# Patient Record
Sex: Female | Born: 1970 | Race: White | Hispanic: No | Marital: Married | Smoking: Never smoker
Health system: Southern US, Community
[De-identification: ages and names within clinical notes are randomized; demographics above are authoritative.]

## PROBLEM LIST (undated history)

## (undated) DIAGNOSIS — S82201A Unspecified fracture of shaft of right tibia, initial encounter for closed fracture: Secondary | ICD-10-CM

## (undated) DIAGNOSIS — G43909 Migraine, unspecified, not intractable, without status migrainosus: Secondary | ICD-10-CM

## (undated) HISTORY — DX: Migraine, unspecified, not intractable, without status migrainosus: G43.909

## (undated) HISTORY — DX: Unspecified fracture of shaft of right tibia, initial encounter for closed fracture: S82.201A

## (undated) HISTORY — PX: NO PAST SURGERIES: SHX2092

---

## 2011-10-14 ENCOUNTER — Ambulatory Visit: Payer: Self-pay | Admitting: Obstetrics and Gynecology

## 2011-11-09 ENCOUNTER — Ambulatory Visit: Payer: Self-pay | Admitting: Family Medicine

## 2012-10-14 ENCOUNTER — Ambulatory Visit: Payer: Self-pay | Admitting: Family

## 2013-01-18 ENCOUNTER — Emergency Department: Payer: Self-pay | Admitting: Internal Medicine

## 2013-10-18 ENCOUNTER — Ambulatory Visit: Payer: Self-pay | Admitting: Obstetrics and Gynecology

## 2013-10-23 ENCOUNTER — Ambulatory Visit: Payer: Self-pay | Admitting: Obstetrics and Gynecology

## 2015-12-06 ENCOUNTER — Telehealth: Payer: Self-pay

## 2015-12-06 NOTE — Telephone Encounter (Signed)
LVM FOR PT TO CALL BACK AND SCHEDULE AN APPT WITH DR. Nolon Rod

## 2017-02-16 ENCOUNTER — Encounter (INDEPENDENT_AMBULATORY_CARE_PROVIDER_SITE_OTHER): Payer: Self-pay

## 2017-02-16 ENCOUNTER — Ambulatory Visit (INDEPENDENT_AMBULATORY_CARE_PROVIDER_SITE_OTHER): Payer: BLUE CROSS/BLUE SHIELD | Admitting: Internal Medicine

## 2017-02-16 ENCOUNTER — Encounter: Payer: Self-pay | Admitting: Internal Medicine

## 2017-02-16 VITALS — BP 116/78 | HR 71 | Temp 98.0°F | Wt 174.0 lb

## 2017-02-16 DIAGNOSIS — Z8349 Family history of other endocrine, nutritional and metabolic diseases: Secondary | ICD-10-CM

## 2017-02-16 DIAGNOSIS — G43839 Menstrual migraine, intractable, without status migrainosus: Secondary | ICD-10-CM

## 2017-02-16 DIAGNOSIS — G43909 Migraine, unspecified, not intractable, without status migrainosus: Secondary | ICD-10-CM | POA: Insufficient documentation

## 2017-02-16 LAB — CBC
HCT: 42.2 % (ref 36.0–46.0)
Hemoglobin: 14.4 g/dL (ref 12.0–15.0)
MCHC: 34.2 g/dL (ref 30.0–36.0)
MCV: 88.7 fl (ref 78.0–100.0)
PLATELETS: 233 10*3/uL (ref 150.0–400.0)
RBC: 4.76 Mil/uL (ref 3.87–5.11)
RDW: 13.4 % (ref 11.5–15.5)
WBC: 5.8 10*3/uL (ref 4.0–10.5)

## 2017-02-16 LAB — COMPREHENSIVE METABOLIC PANEL
ALT: 24 U/L (ref 0–35)
AST: 20 U/L (ref 0–37)
Albumin: 4.1 g/dL (ref 3.5–5.2)
Alkaline Phosphatase: 55 U/L (ref 39–117)
BILIRUBIN TOTAL: 0.6 mg/dL (ref 0.2–1.2)
BUN: 20 mg/dL (ref 6–23)
CO2: 28 meq/L (ref 19–32)
CREATININE: 0.63 mg/dL (ref 0.40–1.20)
Calcium: 9.4 mg/dL (ref 8.4–10.5)
Chloride: 102 mEq/L (ref 96–112)
GFR: 107.01 mL/min (ref >60.00)
GLUCOSE: 99 mg/dL (ref 70–99)
Potassium: 3.7 mEq/L (ref 3.5–5.1)
Sodium: 137 mEq/L (ref 135–145)
Total Protein: 7.4 g/dL (ref 6.0–8.3)

## 2017-02-16 LAB — IBC PANEL
IRON: 113 ug/dL (ref 42–145)
SATURATION RATIOS: 29.7 % (ref 20.0–50.0)
TRANSFERRIN: 272 mg/dL (ref 212.0–360.0)

## 2017-02-16 LAB — FERRITIN: Ferritin: 68.2 ng/mL (ref 10.0–291.0)

## 2017-02-16 NOTE — Patient Instructions (Signed)
Hemochromatosis Hemochromatosis, also called iron storage disease, is a condition in which the body stores too much iron. The excess iron builds up in your joints, heart, liver, pancreas, and other organs and damages them. What are the causes? Hemochromatosis may be caused by:  Abnormal genes. These are passed down (inherited) from both of your parents.  Having blood transfusions.  Having too much iron in your diet.  What increases the risk?  Being Caucasian.  Inheriting abnormal genes from both your parents.  Having a severe or long-term loss of red blood cells (anemia). What are the signs or symptoms? Signs and symptoms can start at any age, but usually start in middle age. They may include:  Fatigue.  Weakness.  Joint pain.  Abdominal pain.  Weight loss.  Pearline Cables or bronze skin coloring.  Loss of interest in sex.  Loss of menstrual periods in women.  Loss of body hair.  Shortness of breath.  Late signs of hemochromatosis include damage to the liver, heart, or pancreas. This may lead to:  Liver cancer.  Abnormal heart rhythms.  Heart failure.  Diabetes.  How is this diagnosed? Your health care provider will perform a physical exam and ask about your symptoms and family history. Tests may be done to confirm the diagnosis. Blood tests may include:  Transferrin saturation. This test measures how much iron is bound to hemoglobin in your blood. Hemoglobin is a substance in red blood cells that carries oxygen to the tissues of the body.  Serum ferritin. This test measures a protein that stores iron in your blood.  A test to check for the abnormal genes that cause the condition.  You may have a tissue sample taken from your liver with a needle (biopsy) for testing. The results will show if iron is building up in your liver. How is this treated? Hemochromatosis is treated by removing iron by taking blood (phlebotomy). Having a phlebotomy is similar to having blood  taken for donation. The procedure is simple and effective as long as it is started before organ damage develops. When you begin treatment, you may have a pint of blood removed once or twice a week. You may have blood tests done to determine when your iron levels return to normal. Once your iron levels are normal, you may only need to have a phlebotomy every few months. Follow these instructions at home:  Do not eat a lot of foods that are high in iron. Iron-rich foods include red meats and organ meats.  Do not take dietary supplements that contain iron.  Do not take vitamin C supplements. Vitamin C increases iron absorption.  Do not eat raw shellfish or raw fish. Hemochromatosis may increase your chance of infection from these foods.  If you have liver damage, do not drink any alcohol.  If you do not have liver damage, limit alcohol intake to no more than 1 drink per day for nonpregnant women and 2 drinks per day for men. One drink equals 12 ounces of beer, 5 ounces of wine, or 1 ounces of hard liquor.  Try to exercise for at least 30 minutes on most days of the week.  Keep all follow-up visits as directed by your health care provider. This is important. Contact a health care provider if:  You have fatigue.  You have weakness.  You have joint pain.  You have abdominal pain.  You experience weight loss.  You have shortness of breath. Get help right away if:  You have  chest pain.  You have trouble breathing. This information is not intended to replace advice given to you by your health care provider. Make sure you discuss any questions you have with your health care provider. Document Released: 12/20/1999 Document Revised: 05/30/2015 Document Reviewed: 02/15/2013 Elsevier Interactive Patient Education  [employer].

## 2017-02-16 NOTE — Assessment & Plan Note (Signed)
Controlled with Excedrin and Imitrex Will monitor for now

## 2017-02-16 NOTE — Progress Notes (Signed)
HPI  Pt presents to the clinic today to establish care. She has not had a PCP in many years. She has been seeing GYN.  Migraines: These occur monthly around her period. She takes Excedrin Migraine, if that doesn't work, she will take Imitrex nasal spray. She was on OCP's around age 47's and her headaches were not    Family History of Hemachromatosis: She has never been screened from this. Her sister actually passed away from complications of this. She would like to be screened today.   Flu: 2017 with employer Tetanus: > 10 years ago Pap Smear: 08/2014, [facility] Rout Mammogram: 10/2014 Vision Screening: annually  Dentist: biannually  Past Medical History:  Diagnosis Date  . Migraines     Current Outpatient Medications  Medication Sig Dispense Refill  . aspirin-acetaminophen-caffeine (EXCEDRIN MIGRAINE) 250-250-65 MG tablet Take 2 tablets by mouth every 6 (six) hours as needed for headache.    . SUMAtriptan (IMITREX) 20 MG/ACT nasal spray Place 20 mg into the nose every 2 (two) hours as needed for migraine or headache. May repeat in 2 hours if headache persists or recurs.     No current facility-administered medications for this visit.     Allergies  Allergen Reactions  . Penicillins Hives    Family History  Problem Relation Age of Onset  . Heart attack Mother   . Ovarian cancer Mother   . Stroke Father   . Hemochromatosis Sister   . Ovarian cancer Sister     Social History   Socioeconomic History  . Marital status: Single    Spouse name: Not on file  . Number of children: Not on file  . Years of education: Not on file  . Highest education level: Not on file  Social Needs  . Financial resource strain: Not on file  . Food insecurity - worry: Not on file  . Food insecurity - inability: Not on file  . Transportation needs - medical: Not on file  . Transportation needs - non-medical: Not on file  Occupational History  . Not on file  Tobacco Use  . Smoking status:  Never Smoker  . Smokeless tobacco: Never Used  Substance and Sexual Activity  . Alcohol use: Yes    Comment: occasional   . Drug use: No  . Sexual activity: Not on file  Other Topics Concern  . Not on file  Social History Narrative  . Not on file    ROS:  Constitutional: Pt reports intermittent headaches. Denies fever, malaise, fatigue, or abrupt weight changes.  Respiratory: Denies difficulty breathing, shortness of breath, cough or sputum production.   Cardiovascular: Denies chest pain, chest tightness, palpitations or swelling in the hands or feet.  Gastrointestinal: Denies abdominal pain, bloating, constipation, diarrhea or blood in the stool.  GU: Denies frequency, urgency, pain with urination, blood in urine, odor or discharge. Neurological: Denies dizziness, difficulty with memory, difficulty with speech or problems with balance and coordination.  Psych: Denies anxiety, depression, SI/HI.  No other specific complaints in a complete review of systems (except as listed in HPI above).  PE:  BP 116/78   Pulse 71   Temp 98 F (36.7 C) (Oral)   Wt 174 lb (78.9 kg)   LMP 02/05/2017   SpO2 98%  Wt Readings from Last 3 Encounters:  02/16/17 174 lb (78.9 kg)    General: Appears her stated age, well developed, well nourished in NAD. Skin: Dry and intact. Cardiovascular: Normal rate and rhythm. Pulmonary/Chest: Normal effort and  positive vesicular breath sounds. No respiratory distress. No wheezes, rales or ronchi noted.  Neurological: Alert and oriented.  Psychiatric: Mood and affect normal. Behavior is normal. Judgment and thought content normal.    Assessment and Plan:  Family History of Hemochromatosis:  Will check CBC, CMET, Ferritin and IBC today May need geneticist referral.  Make an appt for your annual exam Webb Silversmith, NP

## 2017-04-06 ENCOUNTER — Ambulatory Visit (INDEPENDENT_AMBULATORY_CARE_PROVIDER_SITE_OTHER): Payer: BLUE CROSS/BLUE SHIELD | Admitting: Internal Medicine

## 2017-04-06 ENCOUNTER — Encounter: Payer: Self-pay | Admitting: Internal Medicine

## 2017-04-06 VITALS — BP 114/76 | HR 60 | Temp 98.1°F | Ht 65.5 in | Wt 174.0 lb

## 2017-04-06 DIAGNOSIS — Z0001 Encounter for general adult medical examination with abnormal findings: Secondary | ICD-10-CM | POA: Diagnosis not present

## 2017-04-06 DIAGNOSIS — Z1231 Encounter for screening mammogram for malignant neoplasm of breast: Secondary | ICD-10-CM | POA: Diagnosis not present

## 2017-04-06 DIAGNOSIS — Z23 Encounter for immunization: Secondary | ICD-10-CM | POA: Diagnosis not present

## 2017-04-06 DIAGNOSIS — N907 Vulvar cyst: Secondary | ICD-10-CM

## 2017-04-06 DIAGNOSIS — Z1239 Encounter for other screening for malignant neoplasm of breast: Secondary | ICD-10-CM

## 2017-04-06 LAB — COMPREHENSIVE METABOLIC PANEL
ALT: 24 U/L (ref 0–35)
AST: 23 U/L (ref 0–37)
Albumin: 4 g/dL (ref 3.5–5.2)
Alkaline Phosphatase: 59 U/L (ref 39–117)
BUN: 21 mg/dL (ref 6–23)
CHLORIDE: 102 meq/L (ref 96–112)
CO2: 29 mEq/L (ref 19–32)
Calcium: 9.7 mg/dL (ref 8.4–10.5)
Creatinine, Ser: 0.61 mg/dL (ref 0.40–1.20)
GFR: 111.01 mL/min (ref >60.00)
GLUCOSE: 100 mg/dL — AB (ref 70–99)
POTASSIUM: 3.8 meq/L (ref 3.5–5.1)
SODIUM: 136 meq/L (ref 135–145)
Total Bilirubin: 0.5 mg/dL (ref 0.2–1.2)
Total Protein: 7.7 g/dL (ref 6.0–8.3)

## 2017-04-06 LAB — LIPID PANEL
CHOLESTEROL: 152 mg/dL (ref 0–200)
HDL: 51.4 mg/dL (ref >39.00)
LDL CALC: 82 mg/dL (ref 0–99)
NONHDL: 100.44
Total CHOL/HDL Ratio: 3
Triglycerides: 90 mg/dL (ref 0.0–149.0)
VLDL: 18 mg/dL (ref 0.0–40.0)

## 2017-04-06 LAB — CBC
HCT: 43.3 % (ref 36.0–46.0)
HEMOGLOBIN: 14.6 g/dL (ref 12.0–15.0)
MCHC: 33.7 g/dL (ref 30.0–36.0)
MCV: 89.3 fl (ref 78.0–100.0)
Platelets: 244 10*3/uL (ref 150.0–400.0)
RBC: 4.85 Mil/uL (ref 3.87–5.11)
RDW: 13.4 % (ref 11.5–15.5)
WBC: 6.2 10*3/uL (ref 4.0–10.5)

## 2017-04-06 LAB — VITAMIN D 25 HYDROXY (VIT D DEFICIENCY, FRACTURES): VITD: 27.48 ng/mL — AB (ref 30.00–100.00)

## 2017-04-06 NOTE — Progress Notes (Signed)
Subjective:    Patient ID: Alyssa Bonilla, female    DOB: 07-Jun-1970, 47 y.o.   MRN: [id number]  HPI  Pt presents to the clinic today for her annual exam.   Flu: 2017 Tetanus:  >10 years ago Pap Smear: 08/2014 Mammogram: 10/2013 Vision Screening: annually Dentist: quarterly  Diet: She does eat meat. She consumes fruits and veggies daily. She does eat fried foods. She drinks mostly water. Exercise: Walking for 45 minutes 2-3 days of the week.  Review of Systems        Past Medical History:  Diagnosis Date  . Migraines     Current Outpatient Medications  Medication Sig Dispense Refill  . aspirin-acetaminophen-caffeine (EXCEDRIN MIGRAINE) 250-250-65 MG tablet Take 2 tablets by mouth every 6 (six) hours as needed for headache.    . Multiple Vitamins-Minerals (THRIVE FOR LIFE WOMENS) TABS Take 1 tablet by mouth daily.    . SUMAtriptan (IMITREX) 20 MG/ACT nasal spray Place 20 mg into the nose every 2 (two) hours as needed for migraine or headache. May repeat in 2 hours if headache persists or recurs.     No current facility-administered medications for this visit.     Allergies  Allergen Reactions  . Penicillins Hives    Family History  Problem Relation Age of Onset  . Heart attack Mother   . Ovarian cancer Mother   . Stroke Father   . Hemochromatosis Sister   . Ovarian cancer Sister     Social History   Socioeconomic History  . Marital status: Single    Spouse name: Not on file  . Number of children: Not on file  . Years of education: Not on file  . Highest education level: Not on file  Occupational History  . Not on file  Social Needs  . Financial resource strain: Not on file  . Food insecurity:    Worry: Not on file    Inability: Not on file  . Transportation needs:    Medical: Not on file    Non-medical: Not on file  Tobacco Use  . Smoking status: Never Smoker  . Smokeless tobacco: Never Used  Substance and Sexual Activity  . Alcohol use: Yes     Comment: occasional   . Drug use: No  . Sexual activity: Not on file  Lifestyle  . Physical activity:    Days per week: Not on file    Minutes per session: Not on file  . Stress: Not on file  Relationships  . Social connections:    Talks on phone: Not on file    Gets together: Not on file    Attends religious service: Not on file    Active member of club or organization: Not on file    Attends meetings of clubs or organizations: Not on file    Relationship status: Not on file  . Intimate partner violence:    Fear of current or ex partner: Not on file    Emotionally abused: Not on file    Physically abused: Not on file    Forced sexual activity: Not on file  Other Topics Concern  . Not on file  Social History Narrative  . Not on file     Constitutional: Pt reports intermittent headaches. Denies fever, malaise, fatigue, or abrupt weight changes.  HEENT: Denies eye pain, eye redness, ear pain, ringing in the ears, wax buildup, runny nose, nasal congestion, bloody nose, or sore throat. Respiratory: Denies difficulty breathing, shortness of breath,  cough or sputum production.   Cardiovascular: Denies chest pain, chest tightness, palpitations or swelling in the hands or feet.  Gastrointestinal: Pt reports intermittent reflux. Denies abdominal pain, bloating, constipation, diarrhea or blood in the stool.  GU: Denies urgency, frequency, pain with urination, burning sensation, blood in urine, odor or discharge. Musculoskeletal: Denies decrease in range of motion, difficulty with gait, muscle pain or joint pain and swelling.  Skin: Pt reports lump of left labia. Denies redness, rashes, or ulcercations.  Neurological: Denies dizziness, difficulty with memory, difficulty with speech or problems with balance and coordination.  Psych: Denies anxiety, depression, SI/HI.  No other specific complaints in a complete review of systems (except as listed in HPI above).  Objective:   Physical  Exam  BP 114/76   Pulse 60   Temp 98.1 F (36.7 C) (Oral)   Ht 5' 5.5" (1.664 m)   Wt 174 lb (78.9 kg)   SpO2 98%   BMI 28.51 kg/m  Wt Readings from Last 3 Encounters:  04/06/17 174 lb (78.9 kg)  02/16/17 174 lb (78.9 kg)    General: Appears her stated age, well developed, well nourished in NAD. Skin: Warm, dry and intact. Cyst noted of left lower labia majora. HEENT: Head: normal shape and size; Eyes: sclera white, no icterus, conjunctiva pink, PERRLA and EOMs intact; Ears: Tm's gray and intact, normal light reflex; Throat/Mouth: Teeth present, mucosa pink and moist, no exudate, lesions or ulcerations noted.  Neck:  Neck supple, trachea midline. No masses, lumps or thyromegaly present.  Cardiovascular: Normal rate and rhythm. S1,S2 noted.  No murmur, rubs or gallops noted. No JVD or BLE edema.  Pulmonary/Chest: Normal effort and positive vesicular breath sounds. No respiratory distress. No wheezes, rales or ronchi noted.  Abdomen: Soft and nontender. Normal bowel sounds. No distention or masses noted. Liver, spleen and kidneys non palpable. Musculoskeletal: Strength 5/5 BUE/BLE. No difficulty with gait.  Neurological: Alert and oriented. Cranial nerves II-XII grossly intact. Coordination normal.  Psychiatric: Mood and affect normal. Behavior is normal. Judgment and thought content normal.     BMET    Component Value Date/Time   NA 137 02/16/2017 0958   K 3.7 02/16/2017 0958   CL 102 02/16/2017 0958   CO2 28 02/16/2017 0958   GLUCOSE 99 02/16/2017 0958   BUN 20 02/16/2017 0958   CREATININE 0.63 02/16/2017 0958   CALCIUM 9.4 02/16/2017 0958    Lipid Panel  No results found for: CHOL, TRIG, HDL, CHOLHDL, VLDL, LDLCALC  CBC    Component Value Date/Time   WBC 5.8 02/16/2017 0958   RBC 4.76 02/16/2017 0958   HGB 14.4 02/16/2017 0958   HCT 42.2 02/16/2017 0958   PLT 233.0 02/16/2017 0958   MCV 88.7 02/16/2017 0958   MCHC 34.2 02/16/2017 0958   RDW 13.4 02/16/2017 0958     Hgb A1C No results found for: HGBA1C          Assessment & Plan:   Preventative Health Maintenance:  Encouraged her to get a flu shot in the fall Tdap today Pap smear UTD Mammogram ordered, she will call [facility] to schedule, number provided Encouraged her to consume a balanced diet and exercise regimen Continue to see an eye doctor and dentist annually Will check CBC, CMET, Lipid and Vit D today  Cyst of Labia:  Benign Advised her I could refer her to GYN for further evaluation to see if it could be removed, but she will hold off as it is not  bothering her at the moment.  RTC in 1 year, sooner if needed Webb Silversmith, NP

## 2017-04-06 NOTE — Patient Instructions (Signed)
Health Maintenance for Postmenopausal Women Menopause is a normal process in which your reproductive ability comes to an end. This process happens gradually over a span of months to years, usually between the ages of 22 and 9. Menopause is complete when you have missed 12 consecutive menstrual periods. It is important to talk with your health care provider about some of the most common conditions that affect postmenopausal women, such as heart disease, cancer, and bone loss (osteoporosis). Adopting a healthy lifestyle and getting preventive care can help to promote your health and wellness. Those actions can also lower your chances of developing some of these common conditions. What should I know about menopause? During menopause, you may experience a number of symptoms, such as:  Moderate-to-severe hot flashes.  Night sweats.  Decrease in sex drive.  Mood swings.  Headaches.  Tiredness.  Irritability.  Memory problems.  Insomnia.  Choosing to treat or not to treat menopausal changes is an individual decision that you make with your health care provider. What should I know about hormone replacement therapy and supplements? Hormone therapy products are effective for treating symptoms that are associated with menopause, such as hot flashes and night sweats. Hormone replacement carries certain risks, especially as you become older. If you are thinking about using estrogen or estrogen with progestin treatments, discuss the benefits and risks with your health care provider. What should I know about heart disease and stroke? Heart disease, heart attack, and stroke become more likely as you age. This may be due, in part, to the hormonal changes that your body experiences during menopause. These can affect how your body processes dietary fats, triglycerides, and cholesterol. Heart attack and stroke are both medical emergencies. There are many things that you can do to help prevent heart disease  and stroke:  Have your blood pressure checked at least every 1-2 years. High blood pressure causes heart disease and increases the risk of stroke.  If you are 53-22 years old, ask your health care provider if you should take aspirin to prevent a heart attack or a stroke.  Do not use any tobacco products, including cigarettes, chewing tobacco, or electronic cigarettes. If you need help quitting, ask your health care provider.  It is important to eat a healthy diet and maintain a healthy weight. ? Be sure to include plenty of vegetables, fruits, low-fat dairy products, and lean protein. ? Avoid eating foods that are high in solid fats, added sugars, or salt (sodium).  Get regular exercise. This is one of the most important things that you can do for your health. ? Try to exercise for at least 150 minutes each week. The type of exercise that you do should increase your heart rate and make you sweat. This is known as moderate-intensity exercise. ? Try to do strengthening exercises at least twice each week. Do these in addition to the moderate-intensity exercise.  Know your numbers.Ask your health care provider to check your cholesterol and your blood glucose. Continue to have your blood tested as directed by your health care provider.  What should I know about cancer screening? There are several types of cancer. Take the following steps to reduce your risk and to catch any cancer development as early as possible. Breast Cancer  Practice breast self-awareness. ? This means understanding how your breasts normally appear and feel. ? It also means doing regular breast self-exams. Let your health care provider know about any changes, no matter how small.  If you are 40  or older, have a clinician do a breast exam (clinical breast exam or CBE) every year. Depending on your age, family history, and medical history, it may be recommended that you also have a yearly breast X-ray (mammogram).  If you  have a family history of breast cancer, talk with your health care provider about genetic screening.  If you are at high risk for breast cancer, talk with your health care provider about having an MRI and a mammogram every year.  Breast cancer (BRCA) gene test is recommended for women who have family members with BRCA-related cancers. Results of the assessment will determine the need for genetic counseling and BRCA1 and for BRCA2 testing. BRCA-related cancers include these types: ? Breast. This occurs in males or females. ? Ovarian. ? Tubal. This may also be called fallopian tube cancer. ? Cancer of the abdominal or pelvic lining (peritoneal cancer). ? Prostate. ? Pancreatic.  Cervical, Uterine, and Ovarian Cancer Your health care provider may recommend that you be screened regularly for cancer of the pelvic organs. These include your ovaries, uterus, and vagina. This screening involves a pelvic exam, which includes checking for microscopic changes to the surface of your cervix (Pap test).  For women ages 21-65, health care providers may recommend a pelvic exam and a Pap test every three years. For women ages 63-65, they may recommend the Pap test and pelvic exam, combined with testing for human papilloma virus (HPV), every five years. Some types of HPV increase your risk of cervical cancer. Testing for HPV may also be done on women of any age who have unclear Pap test results.  Other health care providers may not recommend any screening for nonpregnant women who are considered low risk for pelvic cancer and have no symptoms. Ask your health care provider if a screening pelvic exam is right for you.  If you have had past treatment for cervical cancer or a condition that could lead to cancer, you need Pap tests and screening for cancer for at least 20 years after your treatment. If Pap tests have been discontinued for you, your risk factors (such as having a new sexual partner) need to be  reassessed to determine if you should start having screenings again. Some women have medical problems that increase the chance of getting cervical cancer. In these cases, your health care provider may recommend that you have screening and Pap tests more often.  If you have a family history of uterine cancer or ovarian cancer, talk with your health care provider about genetic screening.  If you have vaginal bleeding after reaching menopause, tell your health care provider.  There are currently no reliable tests available to screen for ovarian cancer.  Lung Cancer Lung cancer screening is recommended for adults 64-60 years old who are at high risk for lung cancer because of a history of smoking. A yearly low-dose CT scan of the lungs is recommended if you:  Currently smoke.  Have a history of at least 30 pack-years of smoking and you currently smoke or have quit within the past 15 years. A pack-year is smoking an average of one pack of cigarettes per day for one year.  Yearly screening should:  Continue until it has been 15 years since you quit.  Stop if you develop a health problem that would prevent you from having lung cancer treatment.  Colorectal Cancer  This type of cancer can be detected and can often be prevented.  Routine colorectal cancer screening usually begins at  age 42 and continues through age 45.  If you have risk factors for colon cancer, your health care provider may recommend that you be screened at an earlier age.  If you have a family history of colorectal cancer, talk with your health care provider about genetic screening.  Your health care provider may also recommend using home test kits to check for hidden blood in your stool.  A small camera at the end of a tube can be used to examine your colon directly (sigmoidoscopy or colonoscopy). This is done to check for the earliest forms of colorectal cancer.  Direct examination of the colon should be repeated every  5-10 years until age 71. However, if early forms of precancerous polyps or small growths are found or if you have a family history or genetic risk for colorectal cancer, you may need to be screened more often.  Skin Cancer  Check your skin from head to toe regularly.  Monitor any moles. Be sure to tell your health care provider: ? About any new moles or changes in moles, especially if there is a change in a mole's shape or color. ? If you have a mole that is larger than the size of a pencil eraser.  If any of your family members has a history of skin cancer, especially at a young age, talk with your health care provider about genetic screening.  Always use sunscreen. Apply sunscreen liberally and repeatedly throughout the day.  Whenever you are outside, protect yourself by wearing long sleeves, pants, a wide-brimmed hat, and sunglasses.  What should I know about osteoporosis? Osteoporosis is a condition in which bone destruction happens more quickly than new bone creation. After menopause, you may be at an increased risk for osteoporosis. To help prevent osteoporosis or the bone fractures that can happen because of osteoporosis, the following is recommended:  If you are 46-71 years old, get at least 1,000 mg of calcium and at least 600 mg of vitamin D per day.  If you are older than age 55 but younger than age 65, get at least 1,200 mg of calcium and at least 600 mg of vitamin D per day.  If you are older than age 54, get at least 1,200 mg of calcium and at least 800 mg of vitamin D per day.  Smoking and excessive alcohol intake increase the risk of osteoporosis. Eat foods that are rich in calcium and vitamin D, and do weight-bearing exercises several times each week as directed by your health care provider. What should I know about how menopause affects my mental health? Depression may occur at any age, but it is more common as you become older. Common symptoms of depression  include:  Low or sad mood.  Changes in sleep patterns.  Changes in appetite or eating patterns.  Feeling an overall lack of motivation or enjoyment of activities that you previously enjoyed.  Frequent crying spells.  Talk with your health care provider if you think that you are experiencing depression. What should I know about immunizations? It is important that you get and maintain your immunizations. These include:  Tetanus, diphtheria, and pertussis (Tdap) booster vaccine.  Influenza every year before the flu season begins.  Pneumonia vaccine.  Shingles vaccine.  Your health care provider may also recommend other immunizations. This information is not intended to replace advice given to you by your health care provider. Make sure you discuss any questions you have with your health care provider. Document Released: 02/13/2005  Document Revised: 07/12/2015 Document Reviewed: 09/25/2014 Elsevier Interactive Patient Education  2018 [employer]  

## 2017-07-14 ENCOUNTER — Other Ambulatory Visit: Payer: Self-pay

## 2017-07-14 ENCOUNTER — Ambulatory Visit
Admission: EM | Admit: 2017-07-14 | Discharge: 2017-07-14 | Disposition: A | Discharge status: Home / Self Care | Payer: BLUE CROSS/BLUE SHIELD | Attending: Family Medicine | Admitting: Family Medicine

## 2017-07-14 DIAGNOSIS — R21 Rash and other nonspecific skin eruption: Secondary | ICD-10-CM | POA: Diagnosis not present

## 2017-07-14 MED ORDER — METHYLPREDNISOLONE SODIUM SUCC 40 MG IJ SOLR
80.0000 mg | Freq: Once | INTRAMUSCULAR | Status: AC
  Administered 2017-07-14: 80 mg via INTRAMUSCULAR

## 2017-07-14 MED ORDER — PREDNISONE 10 MG (21) PO TBPK: ORAL_TABLET | ORAL | 0 refills | Status: DC

## 2017-07-14 NOTE — Discharge Instructions (Signed)
Take the prednisone if things are not resolving.  Take care  Dr. Lacinda Axon

## 2017-07-14 NOTE — ED Triage Notes (Signed)
Patient complains of rash on neck and chest that started yesterday evening. Patient states that the rash is itchy. Denies any new soaps, detergents or foods.

## 2017-07-14 NOTE — ED Provider Notes (Signed)
[facility]  CSN: [id number] Arrival date & time: 07/14/17  0818  History   Chief Complaint Chief Complaint  Patient presents with  . Rash   HPI  47 year old female presents with rash.  Patient reports that she developed a rash last night.  Started on her upper chest and has now spread to the neck and the left side of the face.  Patient states that it intensely pruritic.  No known inciting factor.  No new exposures or changes.  No medications or interventions tried.  No known exacerbating factors.  No other associated symptoms.  No other complaints.  Past Medical History:  Diagnosis Date  . Migraines    Past Surgical History:  Procedure Laterality Date  . NO PAST SURGERIES     OB History   None    Home Medications    Prior to Admission medications   Medication Sig Start Date End Date Taking? Authorizing Provider  aspirin-acetaminophen-caffeine (EXCEDRIN MIGRAINE) [phone] MG tablet Take 2 tablets by mouth every 6 (six) hours as needed for headache.   Yes [provider]  Multiple Vitamins-Minerals (THRIVE FOR LIFE WOMENS) TABS Take 1 tablet by mouth daily.   Yes [provider]  SUMAtriptan (IMITREX) 20 MG/ACT nasal spray Place 20 mg into the nose every 2 (two) hours as needed for migraine or headache. May repeat in 2 hours if headache persists or recurs.   Yes [provider]  predniSONE (STERAPRED UNI-PAK 21 TAB) 10 MG (21) TBPK tablet 6 tablets on day 1; decrease by 1 tablet daily until gone. 07/14/17   Coral Spikes, DO    Family History Family History  Problem Relation Age of Onset  . Heart attack Mother   . Ovarian cancer Mother   . Stroke Father   . Hemochromatosis Sister   . Ovarian cancer Sister     Social History Social History   Tobacco Use  . Smoking status: Never Smoker  . Smokeless tobacco: Never Used  Substance Use Topics  . Alcohol use: Yes    Comment: occasional   . Drug use: No     Allergies     Penicillins   Review of Systems Review of Systems  Constitutional: Negative.   Skin: Positive for rash.   Physical Exam Triage Vital Signs ED Triage Vitals  Enc Vitals Group     BP 07/14/17 0838 129/87     Pulse Rate 07/14/17 0838 71     Resp 07/14/17 0838 18     Temp 07/14/17 0838 98.3 F (36.8 C)     Temp Source 07/14/17 0838 Oral     SpO2 07/14/17 0838 98 %     Weight 07/14/17 0836 170 lb (77.1 kg)     Height 07/14/17 0836 5\' 6"  (1.676 m)     Head Circumference --      Peak Flow --      Pain Score 07/14/17 0836 0     Pain Loc --      Pain Edu? --      Excl. in [place]? --    Updated Vital Signs BP 129/87 (BP Location: Left Arm)   Pulse 71   Temp 98.3 F (36.8 C) (Oral)   Resp 18   Ht 5\' 6"  (1.676 m)   Wt 170 lb (77.1 kg)   LMP 07/04/2017   SpO2 98%   BMI 27.44 kg/m   Visual Acuity Right Eye Distance:   Left Eye Distance:   Bilateral Distance:  Right Eye Near:   Left Eye Near:    Bilateral Near:     Physical Exam  Constitutional: She is oriented to person, place, and time. She appears well-developed. No distress.  HENT:  Head: Normocephalic and atraumatic.  Pulmonary/Chest: Effort normal. No respiratory distress.  Neurological: She is alert and oriented to person, place, and time.  Skin:  Patient with an erythematous, papular rash of the upper chest, neck, and left side of the face.  Psychiatric: She has a normal mood and affect. Her behavior is normal.  Nursing note and vitals reviewed.   UC Treatments / Results  Labs (all labs ordered are listed, but only abnormal results are displayed) Labs Reviewed - No data to display  EKG None  Radiology No results found.  Procedures Procedures (including critical care time)  Medications Ordered in UC Medications  methylPREDNISolone sodium succinate (SOLU-MEDROL) 40 mg/mL injection 80 mg (80 mg Intramuscular Given 07/14/17 0936)    Initial Impression / Assessment and Plan / UC Course  I have  reviewed the triage vital signs and the nursing notes.  Pertinent labs & imaging results that were available during my care of the patient were reviewed by me and considered in my medical decision making (see chart for details).    47 year old female presents with a rash.  Likely contact or allergic.  IM Solu-Medrol given today.  Advised to start the prednisone if the rash does not resolve/improve quickly with the injection.  Final Clinical Impressions(s) / UC Diagnoses   Final diagnoses:  Rash     Discharge Instructions     Take the prednisone if things are not resolving.  Take care  Dr. Lacinda Axon    ED Prescriptions    Medication Sig Dispense Auth. Provider   predniSONE (STERAPRED UNI-PAK 21 TAB) 10 MG (21) TBPK tablet 6 tablets on day 1; decrease by 1 tablet daily until gone. 21 tablet Coral Spikes, DO     Controlled Substance Prescriptions [place] Controlled Substance Registry consulted? Not Applicable   Coral Spikes, DO 07/14/17 1038

## 2017-10-12 ENCOUNTER — Encounter: Payer: Self-pay | Admitting: Internal Medicine

## 2017-10-14 ENCOUNTER — Ambulatory Visit (INDEPENDENT_AMBULATORY_CARE_PROVIDER_SITE_OTHER): Payer: BLUE CROSS/BLUE SHIELD | Admitting: Internal Medicine

## 2017-10-14 ENCOUNTER — Encounter: Payer: Self-pay | Admitting: Internal Medicine

## 2017-10-14 VITALS — BP 120/82 | HR 62 | Temp 98.2°F | Wt 169.0 lb

## 2017-10-14 DIAGNOSIS — N920 Excessive and frequent menstruation with regular cycle: Secondary | ICD-10-CM | POA: Diagnosis not present

## 2017-10-14 DIAGNOSIS — R61 Generalized hyperhidrosis: Secondary | ICD-10-CM | POA: Diagnosis not present

## 2017-10-14 DIAGNOSIS — R4586 Emotional lability: Secondary | ICD-10-CM

## 2017-10-14 DIAGNOSIS — L308 Other specified dermatitis: Secondary | ICD-10-CM

## 2017-10-14 DIAGNOSIS — N951 Menopausal and female climacteric states: Secondary | ICD-10-CM | POA: Diagnosis not present

## 2017-10-14 MED ORDER — TRIAMCINOLONE ACETONIDE 0.1 % EX CREA: 1.0000 "application " | TOPICAL_CREAM | Freq: Two times a day (BID) | CUTANEOUS | 0 refills | Status: DC

## 2017-10-14 MED ORDER — NORGESTIM-ETH ESTRAD TRIPHASIC 0.18/0.215/0.25 MG-35 MCG PO TABS: 1.0000 | ORAL_TABLET | Freq: Every day | ORAL | 3 refills | Status: DC

## 2017-10-14 NOTE — Patient Instructions (Signed)
Perimenopause Perimenopause is the time when your body begins to move into the menopause (no menstrual period for 12 straight months). It is a natural process. Perimenopause can begin 2-8 years before the menopause and usually lasts for 1 year after the menopause. During this time, your ovaries may or may not produce an egg. The ovaries vary in their production of estrogen and progesterone hormones each month. This can cause irregular menstrual periods, difficulty getting pregnant, vaginal bleeding between periods, and uncomfortable symptoms. What are the causes?  Irregular production of the ovarian hormones, estrogen and progesterone, and not ovulating every month. Other causes include:  Tumor of the pituitary gland in the brain.  Medical disease that affects the ovaries.  Radiation treatment.  Chemotherapy.  Unknown causes.  Heavy smoking and excessive alcohol intake can bring on perimenopause sooner. What are the signs or symptoms?  Hot flashes.  Night sweats.  Irregular menstrual periods.  Decreased sex drive.  Vaginal dryness.  Headaches.  Mood swings.  Depression.  Memory problems.  Irritability.  Tiredness.  Weight gain.  Trouble getting pregnant.  The beginning of losing bone cells (osteoporosis).  The beginning of hardening of the arteries (atherosclerosis). How is this diagnosed? Your health care provider will make a diagnosis by analyzing your age, menstrual history, and symptoms. He or she will do a physical exam and note any changes in your body, especially your female organs. Female hormone tests may or may not be helpful depending on the amount of female hormones you produce and when you produce them. However, other hormone tests may be helpful to rule out other problems. How is this treated? In some cases, no treatment is needed. The decision on whether treatment is necessary during the perimenopause should be made by you and your health care  provider based on how the symptoms are affecting you and your lifestyle. Various treatments are available, such as:  Treating individual symptoms with a specific medicine for that symptom.  Herbal medicines that can help specific symptoms.  Counseling.  Group therapy. Follow these instructions at home:  Keep track of your menstrual periods (when they occur, how heavy they are, how long between periods, and how long they last) as well as your symptoms and when they started.  Only take over-the-counter or prescription medicines as directed by your health care provider.  Sleep and rest.  Exercise.  Eat a diet that contains calcium (good for your bones) and soy (acts like the estrogen hormone).  Do not smoke.  Avoid alcoholic beverages.  Take vitamin supplements as recommended by your health care provider. Taking vitamin E may help in certain cases.  Take calcium and vitamin D supplements to help prevent bone loss.  Group therapy is sometimes helpful.  Acupuncture may help in some cases. Contact a health care provider if:  You have questions about any symptoms you are having.  You need a referral to a specialist (gynecologist, psychiatrist, or psychologist). Get help right away if:  You have vaginal bleeding.  Your period lasts longer than 8 days.  Your periods are recurring sooner than 21 days.  You have bleeding after intercourse.  You have severe depression.  You have pain when you urinate.  You have severe headaches.  You have vision problems. This information is not intended to replace advice given to you by your health care provider. Make sure you discuss any questions you have with your health care provider. Document Released: 01/30/2004 Document Revised: 05/30/2015 Document Reviewed: 07/21/2012 Elsevier Interactive   Patient Education  2017 [employer].  

## 2017-10-14 NOTE — Progress Notes (Signed)
Subjective:    Patient ID: Alyssa Bonilla, female    DOB: September 30, 1970, 47 y.o.   MRN: [id number]  HPI  Pt presents to the clinic today to discuss night sweats, mood swings and heavy periods. She noticed this about 3 months ago. She denies anxiety or depression, but just reports she has been more irritable. She reports her periods have been much heavier and lasting 5 days which is longer than usual for her. Her LMP was 10/08/17.  Her last pap smear was 08/2014, [facility]. She has not tried anything OTC for her symptoms.  She also reports an intermittent rash of her right upper abdomen. She noticed this 2-3 months ago. The rash itches. She denies changes in soaps, lotions or detergents. No one around her has a similar rash. She has tried Aveeno with minimal relief.   Review of Systems      Past Medical History:  Diagnosis Date  . Migraines     Current Outpatient Medications  Medication Sig Dispense Refill  . aspirin-acetaminophen-caffeine (EXCEDRIN MIGRAINE) 250-250-65 MG tablet Take 2 tablets by mouth every 6 (six) hours as needed for headache.    . Multiple Vitamins-Minerals (THRIVE FOR LIFE WOMENS) TABS Take 1 tablet by mouth daily.    . predniSONE (STERAPRED UNI-PAK 21 TAB) 10 MG (21) TBPK tablet 6 tablets on day 1; decrease by 1 tablet daily until gone. 21 tablet 0  . SUMAtriptan (IMITREX) 20 MG/ACT nasal spray Place 20 mg into the nose every 2 (two) hours as needed for migraine or headache. May repeat in 2 hours if headache persists or recurs.     No current facility-administered medications for this visit.     Allergies  Allergen Reactions  . Penicillins Hives    Family History  Problem Relation Age of Onset  . Heart attack Mother   . Ovarian cancer Mother   . Stroke Father   . Hemochromatosis Sister   . Ovarian cancer Sister     Social History   Socioeconomic History  . Marital status: Single    Spouse name: Not on file  . Number of children: Not on  file  . Years of education: Not on file  . Highest education level: Not on file  Occupational History  . Not on file  Social Needs  . Financial resource strain: Not on file  . Food insecurity:    Worry: Not on file    Inability: Not on file  . Transportation needs:    Medical: Not on file    Non-medical: Not on file  Tobacco Use  . Smoking status: Never Smoker  . Smokeless tobacco: Never Used  Substance and Sexual Activity  . Alcohol use: Yes    Comment: occasional   . Drug use: No  . Sexual activity: Not on file  Lifestyle  . Physical activity:    Days per week: Not on file    Minutes per session: Not on file  . Stress: Not on file  Relationships  . Social connections:    Talks on phone: Not on file    Gets together: Not on file    Attends religious service: Not on file    Active member of club or organization: Not on file    Attends meetings of clubs or organizations: Not on file    Relationship status: Not on file  . Intimate partner violence:    Fear of current or ex partner: Not on file    Emotionally  abused: Not on file    Physically abused: Not on file    Forced sexual activity: Not on file  Other Topics Concern  . Not on file  Social History Narrative  . Not on file     Constitutional: Denies fever, malaise, fatigue, headache or abrupt weight changes.  HEENT: Denies eye pain, eye redness, ear pain, ringing in the ears, wax buildup, runny nose, nasal congestion, bloody nose, or sore throat. Respiratory: Denies difficulty breathing, shortness of breath, cough or sputum production.   Cardiovascular: Denies chest pain, chest tightness, palpitations or swelling in the hands or feet.  Gastrointestinal: Denies abdominal pain, bloating, constipation, diarrhea or blood in the stool.  GU: Pt reports heavy periods. Denies urgency, frequency, pain with urination, burning sensation, blood in urine, odor or discharge. Musculoskeletal: Denies decrease in range of motion,  difficulty with gait, muscle pain or joint pain and swelling.  Skin: Pt reports rash. Denies redness, lesions or ulcercations.  Neurological: Pt reports night sweats. Denies dizziness, difficulty with memory, difficulty with speech or problems with balance and coordination.  Psych: Pt reports mood swings. Denies anxiety, depression, SI/HI.  No other specific complaints in a complete review of systems (except as listed in HPI above).  Objective:   Physical Exam  BP 120/82   Pulse 62   Temp 98.2 F (36.8 C) (Oral)   Wt 169 lb (76.7 kg)   LMP 10/08/2017   SpO2 98%   BMI 27.28 kg/m  Wt Readings from Last 3 Encounters:  10/14/17 169 lb (76.7 kg)  07/14/17 170 lb (77.1 kg)  04/06/17 174 lb (78.9 kg)    General: Appears her stated age, well developed, well nourished in NAD. Skin: Warm, dry and intact. Patch of eczema noted to right upper quadrant. Cardiovascular: Normal rate and rhythm. S1,S2 noted.  No murmur, rubs or gallops noted. Pulmonary/Chest: Normal effort and positive vesicular breath sounds. No respiratory distress. No wheezes, rales or ronchi noted.  Abdomen: Soft and nontender. Normal bowel sounds. No distention or masses noted.  MNeurological: Alert and oriented.  Psychiatric: Mood and affect normal. Behavior is normal. Judgment and thought content normal.     BMET    Component Value Date/Time   NA 136 04/06/2017 0947   K 3.8 04/06/2017 0947   CL 102 04/06/2017 0947   CO2 29 04/06/2017 0947   GLUCOSE 100 (H) 04/06/2017 0947   BUN 21 04/06/2017 0947   CREATININE 0.61 04/06/2017 0947   CALCIUM 9.7 04/06/2017 0947    Lipid Panel     Component Value Date/Time   CHOL 152 04/06/2017 0947   TRIG 90.0 04/06/2017 0947   HDL 51.40 04/06/2017 0947   CHOLHDL 3 04/06/2017 0947   VLDL 18.0 04/06/2017 0947   LDLCALC 82 04/06/2017 0947    CBC    Component Value Date/Time   WBC 6.2 04/06/2017 0947   RBC 4.85 04/06/2017 0947   HGB 14.6 04/06/2017 0947   HCT 43.3  04/06/2017 0947   PLT 244.0 04/06/2017 0947   MCV 89.3 04/06/2017 0947   MCHC 33.7 04/06/2017 0947   RDW 13.4 04/06/2017 0947    Hgb A1C No results found for: HGBA1C          Assessment & Plan:   Perimenopausal Symptoms:  Discussed [place] Cohosh OTC She has take Ortho Tri Cyclen in the past and tolerated well, RX provided today Discussed how this will help treat her perimenopausal symptoms She is a non smoker Discussed risk of clots, no  prior history  Eczema:  eRx for Triamcinolone 0.1% BID prn Continue Aveeno BID Discussed avoiding hot showers or baths  Update me in 3 months via mychart and let me know how your perimenopausal symptoms are Webb Silversmith, NP

## 2018-02-11 ENCOUNTER — Other Ambulatory Visit: Payer: Self-pay | Admitting: Internal Medicine

## 2018-03-29 ENCOUNTER — Encounter: Payer: Self-pay | Admitting: Internal Medicine

## 2018-03-30 ENCOUNTER — Other Ambulatory Visit: Payer: Self-pay

## 2018-03-30 ENCOUNTER — Telehealth (INDEPENDENT_AMBULATORY_CARE_PROVIDER_SITE_OTHER): Payer: BLUE CROSS/BLUE SHIELD | Admitting: Internal Medicine

## 2018-03-30 DIAGNOSIS — G43909 Migraine, unspecified, not intractable, without status migrainosus: Secondary | ICD-10-CM

## 2018-03-30 MED ORDER — BUTALBITAL-APAP-CAFFEINE 50-325-40 MG PO TABS: 1.0000 | ORAL_TABLET | Freq: Four times a day (QID) | ORAL | 2 refills | Status: AC | PRN

## 2018-03-30 NOTE — Progress Notes (Signed)
Virtual Visit via Telephone Note  I connected with Alyssa Bonilla on 03/30/18 at  2:30 PM EDT by telephone and verified that I am speaking with the correct person using two identifiers.   I discussed the limitations, risks, security and privacy concerns of performing an evaluation and management service by telephone and the availability of in person appointments. I also discussed with the patient that there may be a patient responsible charge related to this service. The patient expressed understanding and agreed to proceed.   History of Present Illness: Pt reports migraine. She reports this started around lunch time. The headache is located behind her eyes. She reports associated blurred vision, sensitivity to light and smells, nausea. She denies dizziness, sensitivity to sound or vomiting. She had taken Excedrin and laid down in a dark room with good relief. She has Imitrex to take but reports that makes he drowsy and she can't take it at work. Her migraines are menstrual, but have improved greatly with OCP's.    Observations/Objective: Alert and oriented x 3. NAD.Judgement and thought content seem normal.    Assessment and Plan:   Migraine:  Resolved now Encouraged rest and adequate sleep Continue OCP's Continue Excedrin OTC If not effective, can take Fioricet, RX sent to pharmacy Continue Imitrex if migraine occurs outside of work   Follow Up Instructions:    I discussed the assessment and treatment plan with the patient. The patient was provided an opportunity to ask questions and all were answered. The patient agreed with the plan and demonstrated an understanding of the instructions.   The patient was advised to call back or seek an in-person evaluation if the symptoms worsen or if the condition fails to improve as anticipated.  I provided 6 minutes of non-face-to-face time during this encounter.   Webb Silversmith, NP

## 2018-03-30 NOTE — Patient Instructions (Signed)

## 2018-09-26 ENCOUNTER — Ambulatory Visit
Admission: EM | Admit: 2018-09-26 | Discharge: 2018-09-26 | Disposition: A | Discharge status: Home / Self Care | Payer: BC Managed Care – PPO | Attending: Family Medicine | Admitting: Family Medicine

## 2018-09-26 ENCOUNTER — Other Ambulatory Visit: Payer: Self-pay

## 2018-09-26 DIAGNOSIS — R51 Headache: Secondary | ICD-10-CM

## 2018-09-26 DIAGNOSIS — H6501 Acute serous otitis media, right ear: Secondary | ICD-10-CM | POA: Diagnosis not present

## 2018-09-26 DIAGNOSIS — R519 Headache, unspecified: Secondary | ICD-10-CM

## 2018-09-26 MED ORDER — SULFAMETHOXAZOLE-TRIMETHOPRIM 800-160 MG PO TABS: 1.0000 | ORAL_TABLET | Freq: Two times a day (BID) | ORAL | 0 refills | Status: DC

## 2018-09-26 NOTE — ED Notes (Signed)
Discharged by provider

## 2018-09-26 NOTE — Discharge Instructions (Addendum)
Over the counter flonase steroid nose spray Over the counter antihistamine/decongestant

## 2018-09-26 NOTE — ED Provider Notes (Signed)
[facility]    CSN: [id number] Arrival date & time: 09/26/18  1052      History   Chief Complaint Chief Complaint  Patient presents with  . Headache    HPI Alyssa Bonilla is a 48 y.o. female.   48 yo female with a 3 days h/o sinus pressure, headache on the right as well as nasal congestion and right ear pressure. Denies any fevers, chills, cough, shortness of breath.    Headache   Past Medical History:  Diagnosis Date  . Migraines     Patient Active Problem List   Diagnosis Date Noted  . Migraines 02/16/2017    Past Surgical History:  Procedure Laterality Date  . NO PAST SURGERIES      OB History   No obstetric history on file.      Home Medications    Prior to Admission medications   Medication Sig Start Date End Date Taking? Authorizing Provider  aspirin-acetaminophen-caffeine (EXCEDRIN MIGRAINE) [phone] MG tablet Take 2 tablets by mouth every 6 (six) hours as needed for headache.   Yes [provider]  Calcium Carbonate-Vit D-Min (CALCIUM 1200 PO) Take by mouth.   Yes [provider]  Norgestimate-Ethinyl Estradiol Triphasic 0.18/0.215/0.25 MG-35 MCG tablet Take 1 tablet by mouth daily. 10/14/17  Yes Baity, Coralie Keens, NP  butalbital-acetaminophen-caffeine (FIORICET, ESGIC) [phone] MG tablet Take 1-2 tablets by mouth every 6 (six) hours as needed for headache. 03/30/18 03/30/19  Jearld Fenton, NP  Cholecalciferol (VITAMIN D3) 2000 units capsule Take 2,000 Units by mouth daily.    [provider]  sulfamethoxazole-trimethoprim (BACTRIM DS) 800-160 MG tablet Take 1 tablet by mouth 2 (two) times daily. 09/26/18   Norval Gable, MD  SUMAtriptan (IMITREX) 20 MG/ACT nasal spray Place 20 mg into the nose every 2 (two) hours as needed for migraine or headache. May repeat in 2 hours if headache persists or recurs.    [provider]  triamcinolone cream (KENALOG) 0.1 % APPLY TO AFFECTED AREA TWICE A DAY 02/11/18    Jearld Fenton, NP    Family History Family History  Problem Relation Age of Onset  . Heart attack Mother   . Ovarian cancer Mother   . Stroke Father   . Hemochromatosis Sister   . Ovarian cancer Sister     Social History Social History   Tobacco Use  . Smoking status: Never Smoker  . Smokeless tobacco: Never Used  Substance Use Topics  . Alcohol use: Yes    Comment: occasional   . Drug use: No     Allergies   Penicillins   Review of Systems Review of Systems  Neurological: Positive for headaches.     Physical Exam Triage Vital Signs ED Triage Vitals  Enc Vitals Group     BP 09/26/18 1109 (!) 140/96     Pulse Rate 09/26/18 1109 66     Resp 09/26/18 1109 16     Temp 09/26/18 1109 98.2 F (36.8 C)     Temp Source 09/26/18 1109 Oral     SpO2 09/26/18 1109 100 %     Weight 09/26/18 1110 168 lb (76.2 kg)     Height 09/26/18 1110 5\' 6"  (1.676 m)     Head Circumference --      Peak Flow --      Pain Score 09/26/18 1110 5     Pain Loc --      Pain Edu? --      Excl.  in [place]? --    No data found.  Updated Vital Signs BP (!) 140/96 (BP Location: Left Arm)   Pulse 66   Temp 98.2 F (36.8 C) (Oral)   Resp 16   Ht 5\' 6"  (1.676 m)   Wt 76.2 kg   LMP 09/12/2018 (Approximate)   SpO2 100%   BMI 27.12 kg/m   Visual Acuity Right Eye Distance:   Left Eye Distance:   Bilateral Distance:    Right Eye Near:   Left Eye Near:    Bilateral Near:     Physical Exam Vitals signs and nursing note reviewed.  Constitutional:      General: She is not in acute distress.    Appearance: She is not toxic-appearing or diaphoretic.  HENT:     Head: Normocephalic and atraumatic.     Right Ear: A middle ear effusion is present. Tympanic membrane is erythematous and bulging.     Left Ear: A middle ear effusion is present.     Nose:     Right Sinus: Maxillary sinus tenderness and frontal sinus tenderness present.     Left Sinus: No maxillary sinus tenderness or frontal  sinus tenderness.  Eyes:     Extraocular Movements: Extraocular movements intact.     Pupils: Pupils are equal, round, and reactive to light.  Neck:     Musculoskeletal: Normal range of motion and neck supple.  Neurological:     General: No focal deficit present.     Mental Status: She is alert and oriented to person, place, and time.      UC Treatments / Results  Labs (all labs ordered are listed, but only abnormal results are displayed) Labs Reviewed - No data to display  EKG   Radiology No results found.  Procedures Procedures (including critical care time)  Medications Ordered in UC Medications - No data to display  Initial Impression / Assessment and Plan / UC Course  I have reviewed the triage vital signs and the nursing notes.  Pertinent labs & imaging results that were available during my care of the patient were reviewed by me and considered in my medical decision making (see chart for details).      Final Clinical Impressions(s) / UC Diagnoses   Final diagnoses:  Sinus headache  Right acute serous otitis media, recurrence not specified     Discharge Instructions     Over the counter flonase steroid nose spray Over the counter antihistamine/decongestant     ED Prescriptions    Medication Sig Dispense Auth. Provider   sulfamethoxazole-trimethoprim (BACTRIM DS) 800-160 MG tablet Take 1 tablet by mouth 2 (two) times daily. 14 tablet [name], Linward Foster, MD      1. diagnosis reviewed with patient 2. rx as per orders above; reviewed possible side effects, interactions, risks and benefits  3. Recommend supportive treatment as above 4. Follow-up prn if symptoms worsen or don't improve   PDMP not reviewed this encounter.   Norval Gable, MD 09/26/18 [phone]

## 2018-09-26 NOTE — ED Triage Notes (Signed)
Headache that started yesterday, has taken OTC with no relief. Pt denies NV or photosensitivity. Pt alert and oriented X4, cooperative, RR even and unlabored, color WNL. Pt in NAD.

## 2018-09-28 ENCOUNTER — Encounter: Payer: Self-pay | Admitting: Family Medicine

## 2018-09-28 ENCOUNTER — Ambulatory Visit (INDEPENDENT_AMBULATORY_CARE_PROVIDER_SITE_OTHER): Payer: BC Managed Care – PPO | Admitting: Family Medicine

## 2018-09-28 ENCOUNTER — Other Ambulatory Visit: Payer: Self-pay

## 2018-09-28 VITALS — Ht 66.0 in | Wt 169.0 lb

## 2018-09-28 DIAGNOSIS — R51 Headache: Secondary | ICD-10-CM

## 2018-09-28 DIAGNOSIS — H6591 Unspecified nonsuppurative otitis media, right ear: Secondary | ICD-10-CM

## 2018-09-28 DIAGNOSIS — R519 Headache, unspecified: Secondary | ICD-10-CM

## 2018-09-28 NOTE — Progress Notes (Signed)
Virtual Visit via Video Note  I connected with Alyssa Bonilla on 09/28/18 at  2:00 PM EDT by a video enabled telemedicine application and verified that I am speaking with the correct person using two identifiers.  Location: Patient: In her car Provider: [place]   I discussed the limitations of evaluation and management by telemedicine and the availability of in person appointments. The patient expressed understanding and agreed to proceed. History of Present Illness: Chief Complaint  Patient presents with  . Sinusitis    sinus HA, facial pressure, fluid in mainly right ear   This is a 48 yo female who requests a virtual visit today for above cc. This is a 48 year old female who reports 4 days of headache and pain at the back of her neck.  Pain is also on the right side of her neck.  She went to urgent care 2 days ago and was told that she has sinus infection and fluid behind her right ear.  She was given Bactrim DS which she has been tolerating without difficulty.  She is also advised to start Flonase which she did not start because she does not like nasal sprays.  She does admit to some postnasal drainage but does not have any nasal drainage.  No cough.  She reports improvement of symptoms except for some continued right-sided headache.  She has been taking Sinex with temporary relief.  She reports chronic sinus issues.  She denies sore throat or any difficulty swallowing.  She has Fioricet for occasional migraines and took 1.  She is not sure if it took away her headache because it just made her sleep.  And she had headache following day.    Past Medical History:  Diagnosis Date  . Migraines    Past Surgical History:  Procedure Laterality Date  . NO PAST SURGERIES     Family History  Problem Relation Age of Onset  . Heart attack Mother   . Ovarian cancer Mother   . Stroke Father   . Hemochromatosis Sister   . Ovarian cancer Sister    Social History   Tobacco Use   . Smoking status: Never Smoker  . Smokeless tobacco: Never Used  Substance Use Topics  . Alcohol use: Yes    Comment: occasional   . Drug use: No      Observations/Objective: Patient is alert and answers questions appropriately.  Visible skin is unremarkable.  Respirations are even and unlabored.  There is no audible wheeze or witnessed cough.  Mood and affect are appropriate.  There is no obvious nasal congestion. Ht 5\' 6"  (1.676 m)   Wt 169 lb (76.7 kg)   LMP 09/12/2018 (Approximate)   BMI 27.28 kg/m   Assessment and Plan: 1. Fluid level behind tympanic membrane of right ear -She denies ear pain or pressure.  Discussed role of inhaled nasal steroids and suggested that she use if needed.  2. Acute nonintractable headache, unspecified headache type -Recommended that she try over-the-counter NSAIDs such as ibuprofen 600 mg every 8-12 hours, get some extra rest and increase fluids  -Follow-up precautions reviewed   Clarene Reamer, FNP-BC  [facility], [place]  09/28/2018 2:21 PM   Follow Up Instructions:    I discussed the assessment and treatment plan with the patient. The patient was provided an opportunity to ask questions and all were answered. The patient agreed with the plan and demonstrated an understanding of the instructions.  The patient was advised to call back or seek an in-person evaluation if the symptoms worsen or if the condition fails to improve as anticipated.   Elby Beck, FNP

## 2018-10-06 ENCOUNTER — Encounter: Payer: Self-pay | Admitting: Internal Medicine

## 2018-10-06 ENCOUNTER — Encounter: Payer: Self-pay | Admitting: Emergency Medicine

## 2018-10-06 ENCOUNTER — Other Ambulatory Visit: Payer: Self-pay

## 2018-10-06 ENCOUNTER — Ambulatory Visit
Admission: EM | Admit: 2018-10-06 | Discharge: 2018-10-06 | Disposition: A | Discharge status: Home / Self Care | Payer: BC Managed Care – PPO | Attending: Internal Medicine | Admitting: Internal Medicine

## 2018-10-06 DIAGNOSIS — R519 Headache, unspecified: Secondary | ICD-10-CM

## 2018-10-06 MED ORDER — GUAIFENESIN ER 600 MG PO TB12: 600.0000 mg | ORAL_TABLET | Freq: Two times a day (BID) | ORAL | 0 refills | Status: AC

## 2018-10-06 NOTE — ED Provider Notes (Signed)
[facility]    CSN: [id number] Arrival date & time: 10/06/18  1032      History   Chief Complaint Chief Complaint  Patient presents with  . Headache    APPT    HPI Alyssa Bonilla is a 48 y.o. female with a history of migraines comes to urgent care with complaints of right-sided headache which has been persistent over the past couple of days.  Patient was seen here in the urgent care a week ago and is currently being managed for frontal maxillary sinusitis.  Patient says that her symptoms have improved some.  She has 2 right-sided headache which is not throbbing.  No photosensitivity.  No improvement with lights.  She says taking a warm shower is the only thing that relieves the pain.  She currently has postnasal drainage and complains of right-sided throat pain.  No dizziness, fever or chills.  No nausea or vomiting.  No aura. HPI  Past Medical History:  Diagnosis Date  . Migraines     Patient Active Problem List   Diagnosis Date Noted  . Migraines 02/16/2017    Past Surgical History:  Procedure Laterality Date  . NO PAST SURGERIES      OB History   No obstetric history on file.      Home Medications    Prior to Admission medications   Medication Sig Start Date End Date Taking? Authorizing Provider  aspirin-acetaminophen-caffeine (EXCEDRIN MIGRAINE) [phone] MG tablet Take 2 tablets by mouth every 6 (six) hours as needed for headache.   Yes [provider]  butalbital-acetaminophen-caffeine (FIORICET, ESGIC) [phone] MG tablet Take 1-2 tablets by mouth every 6 (six) hours as needed for headache. 03/30/18 03/30/19 Yes Baity, Coralie Keens, NP  Calcium Carbonate-Vit D-Min (CALCIUM 1200 PO) Take by mouth.   Yes [provider]  Cholecalciferol (VITAMIN D3) 2000 units capsule Take 2,000 Units by mouth daily.   Yes [provider]  triamcinolone cream (KENALOG) 0.1 % APPLY TO AFFECTED AREA TWICE A DAY 02/11/18  Yes Baity, Coralie Keens, NP   guaiFENesin (MUCINEX) 600 MG 12 hr tablet Take 1 tablet (600 mg total) by mouth 2 (two) times daily for 10 days. 10/06/18 10/16/18  Chase Picket, MD  Norgestimate-Ethinyl Estradiol Triphasic 0.18/0.215/0.25 MG-35 MCG tablet Take 1 tablet by mouth daily. Patient not taking: Reported on 09/28/2018 10/14/17 10/06/18  Jearld Fenton, NP  SUMAtriptan [facility]) 20 MG/ACT nasal spray Place 20 mg into the nose every 2 (two) hours as needed for migraine or headache. May repeat in 2 hours if headache persists or recurs.  10/06/18  [provider]    Family History Family History  Problem Relation Age of Onset  . Heart attack Mother   . Ovarian cancer Mother   . Stroke Father   . Hemochromatosis Sister   . Ovarian cancer Sister     Social History Social History   Tobacco Use  . Smoking status: Never Smoker  . Smokeless tobacco: Never Used  Substance Use Topics  . Alcohol use: Yes    Comment: occasional   . Drug use: No     Allergies   Penicillins   Review of Systems Review of Systems  Constitutional: Positive for activity change. Negative for chills, fatigue and fever.  HENT: Positive for congestion and postnasal drip. Negative for sinus pressure, sinus pain and voice change.   Eyes: Negative for photophobia, pain, discharge, redness, itching and visual disturbance.  Gastrointestinal: Negative.   Genitourinary: Negative.  Musculoskeletal: Negative.   Skin: Negative.   Neurological: Positive for headaches. Negative for dizziness, weakness, light-headedness and numbness.     Physical Exam Triage Vital Signs ED Triage Vitals  Enc Vitals Group     BP 10/06/18 1122 134/87     Pulse Rate 10/06/18 1122 83     Resp 10/06/18 1122 18     Temp 10/06/18 1122 98 F (36.7 C)     Temp Source 10/06/18 1122 Oral     SpO2 10/06/18 1122 100 %     Weight 10/06/18 1119 166 lb (75.3 kg)     Height 10/06/18 1119 5\' 6"  (1.676 m)     Head Circumference --      Peak Flow --       Pain Score 10/06/18 1119 5     Pain Loc --      Pain Edu? --      Excl. in [place]? --    No data found.  Updated Vital Signs BP 134/87 (BP Location: Right Arm)   Pulse 83   Temp 98 F (36.7 C) (Oral)   Resp 18   Ht 5\' 6"  (1.676 m)   Wt 75.3 kg   LMP 09/12/2018 (Approximate)   SpO2 100%   BMI 26.79 kg/m   Visual Acuity Right Eye Distance:   Left Eye Distance:   Bilateral Distance:    Right Eye Near:   Left Eye Near:    Bilateral Near:     Physical Exam Constitutional:      General: She is not in acute distress.    Appearance: She is well-developed. She is not ill-appearing or toxic-appearing.  HENT:     Mouth/Throat:     Comments: Pharyngeal erythema.  Left middle ear effusion without tympanic membrane erythema.  No discharge. Eyes:     General: No visual field deficit.    Extraocular Movements: Extraocular movements intact.  Neck:     Musculoskeletal: No neck rigidity.  Cardiovascular:     Rate and Rhythm: Normal rate and regular rhythm.  Pulmonary:     Effort: Pulmonary effort is normal. No respiratory distress.     Breath sounds: Normal breath sounds. No rhonchi.  Abdominal:     General: Bowel sounds are normal. There is no distension.     Palpations: Abdomen is soft.     Tenderness: There is no abdominal tenderness.  Musculoskeletal: Normal range of motion.  Lymphadenopathy:     Cervical: No cervical adenopathy.  Skin:    General: Skin is warm.     Capillary Refill: Capillary refill takes less than 2 seconds.     Coloration: Skin is not cyanotic.     Findings: No erythema.  Neurological:     Mental Status: She is alert.     GCS: GCS eye subscore is 4. GCS verbal subscore is 5. GCS motor subscore is 6.     Cranial Nerves: No cranial nerve deficit, dysarthria or facial asymmetry.     Sensory: No sensory deficit.     Motor: No weakness.     Deep Tendon Reflexes: Reflexes normal.      UC Treatments / Results  Labs (all labs ordered are listed, but  only abnormal results are displayed) Labs Reviewed - No data to display  EKG   Radiology No results found.  Procedures Procedures (including critical care time)  Medications Ordered in UC Medications - No data to display  Initial Impression / Assessment and Plan / UC Course  I  have reviewed the triage vital signs and the nursing notes.  Pertinent labs & imaging results that were available during my care of the patient were reviewed by me and considered in my medical decision making (see chart for details).     1.  Sinus headache: Add Mucinex to the treatment regimen Continue taking Tylenol as needed for headaches Continue Flonase use. Hopefully headache will improve.  If patient notices worsening headaches, dizziness, weakness, numbness or tingling she needs to return to urgent care to be reevaluated. Final Clinical Impressions(s) / UC Diagnoses   Final diagnoses:  Sinus headache   Discharge Instructions   None    ED Prescriptions    Medication Sig Dispense Auth. Provider   guaiFENesin (MUCINEX) 600 MG 12 hr tablet Take 1 tablet (600 mg total) by mouth 2 (two) times daily for 10 days. 20 tablet Nolan Tuazon, Myrene Galas, MD     PDMP not reviewed this encounter.   Chase Picket, MD 10/06/18 1256

## 2018-10-06 NOTE — ED Triage Notes (Signed)
Patient c/o headache that started on Tuesday. She was seen on 09/21 and treated for an ear infection and sinus infection. She stated her headache went away on Sunday and Monday but returned on Tuesday.

## 2018-10-17 ENCOUNTER — Encounter: Payer: Self-pay | Admitting: Internal Medicine

## 2018-10-17 MED ORDER — NORGESTIM-ETH ESTRAD TRIPHASIC 0.18/0.215/0.25 MG-35 MCG PO TABS: 1.0000 | ORAL_TABLET | Freq: Every day | ORAL | 0 refills | Status: DC

## 2018-10-24 ENCOUNTER — Other Ambulatory Visit (HOSPITAL_COMMUNITY): Payer: Self-pay | Admitting: Internal Medicine

## 2018-10-24 ENCOUNTER — Ambulatory Visit (HOSPITAL_COMMUNITY)
Admission: RE | Admit: 2018-10-24 | Discharge: 2018-10-24 | Disposition: A | Discharge status: Home / Self Care | Payer: BC Managed Care – PPO | Source: Ambulatory Visit | Attending: Internal Medicine | Admitting: Internal Medicine

## 2018-10-24 ENCOUNTER — Other Ambulatory Visit: Payer: Self-pay

## 2018-10-24 DIAGNOSIS — Z1231 Encounter for screening mammogram for malignant neoplasm of breast: Secondary | ICD-10-CM | POA: Insufficient documentation

## 2018-11-14 ENCOUNTER — Encounter: Payer: BC Managed Care – PPO | Admitting: Internal Medicine

## 2018-11-29 ENCOUNTER — Other Ambulatory Visit: Payer: Self-pay

## 2018-11-29 ENCOUNTER — Ambulatory Visit (INDEPENDENT_AMBULATORY_CARE_PROVIDER_SITE_OTHER): Payer: BC Managed Care – PPO | Admitting: Internal Medicine

## 2018-11-29 ENCOUNTER — Encounter: Payer: Self-pay | Admitting: Internal Medicine

## 2018-11-29 VITALS — BP 110/76 | HR 65 | Temp 97.9°F | Ht 65.25 in | Wt 168.8 lb

## 2018-11-29 DIAGNOSIS — Z1211 Encounter for screening for malignant neoplasm of colon: Secondary | ICD-10-CM | POA: Diagnosis not present

## 2018-11-29 DIAGNOSIS — G43839 Menstrual migraine, intractable, without status migrainosus: Secondary | ICD-10-CM | POA: Diagnosis not present

## 2018-11-29 DIAGNOSIS — Z0000 Encounter for general adult medical examination without abnormal findings: Secondary | ICD-10-CM

## 2018-11-29 LAB — LIPID PANEL
Cholesterol: 153 mg/dL (ref 0–200)
HDL: 50.5 mg/dL (ref >39.00)
LDL Cholesterol: 86 mg/dL (ref 0–99)
NonHDL: 102.53
Total CHOL/HDL Ratio: 3
Triglycerides: 83 mg/dL (ref 0.0–149.0)
VLDL: 16.6 mg/dL (ref 0.0–40.0)

## 2018-11-29 LAB — COMPREHENSIVE METABOLIC PANEL
ALT: 15 U/L (ref 0–35)
AST: 15 U/L (ref 0–37)
Albumin: 3.7 g/dL (ref 3.5–5.2)
Alkaline Phosphatase: 42 U/L (ref 39–117)
BUN: 17 mg/dL (ref 6–23)
CO2: 26 mEq/L (ref 19–32)
Calcium: 9 mg/dL (ref 8.4–10.5)
Chloride: 104 mEq/L (ref 96–112)
Creatinine, Ser: 0.6 mg/dL (ref 0.40–1.20)
GFR: 105.74 mL/min (ref >60.00)
Glucose, Bld: 90 mg/dL (ref 70–99)
Potassium: 3.6 mEq/L (ref 3.5–5.1)
Sodium: 136 mEq/L (ref 135–145)
Total Bilirubin: 0.5 mg/dL (ref 0.2–1.2)
Total Protein: 6.9 g/dL (ref 6.0–8.3)

## 2018-11-29 LAB — CBC
HCT: 39.4 % (ref 36.0–46.0)
Hemoglobin: 13.4 g/dL (ref 12.0–15.0)
MCHC: 34.1 g/dL (ref 30.0–36.0)
MCV: 88.2 fl (ref 78.0–100.0)
Platelets: 215 10*3/uL (ref 150.0–400.0)
RBC: 4.46 Mil/uL (ref 3.87–5.11)
RDW: 13.6 % (ref 11.5–15.5)
WBC: 7.2 10*3/uL (ref 4.0–10.5)

## 2018-11-29 LAB — VITAMIN D 25 HYDROXY (VIT D DEFICIENCY, FRACTURES): VITD: 43.04 ng/mL (ref 30.00–100.00)

## 2018-11-29 NOTE — Progress Notes (Signed)
Subjective:    Patient ID: Alyssa Bonilla, female    DOB: 08/27/1970, 48 y.o.   MRN: [id number]  HPI  Pt presents to the clinic today for her annual exam.  Migraines: Triggered by menstrual cycle hormones. Managed on OCP's, Fioricit and Excedrin Migraine.   Flu: 10/2018 Tetanus: 04/2017 Pap Smear: 08/2014 Mammogram: 10/2018 Colon Screening: never Vision Screening: Annually, Dr Erick Colace Dentist: Biannually with Dr Atilano Median  Diet: She eats meats. She consumes fruits and veggies daily. She tries to avoid fried foods. She drinks mostly water.   Exercise: She walks on treadmill at least 30 min a week x 3 days,.   Review of Systems      Past Medical History:  Diagnosis Date  . Migraines     Current Outpatient Medications  Medication Sig Dispense Refill  . aspirin-acetaminophen-caffeine (EXCEDRIN MIGRAINE) 250-250-65 MG tablet Take 2 tablets by mouth every 6 (six) hours as needed for headache.    . butalbital-acetaminophen-caffeine (FIORICET, ESGIC) 50-325-40 MG tablet Take 1-2 tablets by mouth every 6 (six) hours as needed for headache. 20 tablet 2  . Calcium Carbonate-Vit D-Min (CALCIUM 1200 PO) Take by mouth.    . Cholecalciferol (VITAMIN D3) 2000 units capsule Take 2,000 Units by mouth daily.    . Norgestimate-Ethinyl Estradiol Triphasic 0.18/0.215/0.25 MG-35 MCG tablet Take 1 tablet by mouth daily. 3 Package 0  . triamcinolone cream (KENALOG) 0.1 % APPLY TO AFFECTED AREA TWICE A DAY 30 g 0   No current facility-administered medications for this visit.     Allergies  Allergen Reactions  . Penicillins Hives    Family History  Problem Relation Age of Onset  . Heart attack Mother   . Ovarian cancer Mother   . Stroke Father   . Hemochromatosis Sister   . Ovarian cancer Sister     Social History   Socioeconomic History  . Marital status: Married    Spouse name: Not on file  . Number of children: Not on file  . Years of education: Not on file  . Highest education  level: Not on file  Occupational History  . Not on file  Social Needs  . Financial resource strain: Not on file  . Food insecurity    Worry: Not on file    Inability: Not on file  . Transportation needs    Medical: Not on file    Non-medical: Not on file  Tobacco Use  . Smoking status: Never Smoker  . Smokeless tobacco: Never Used  Substance and Sexual Activity  . Alcohol use: Yes    Comment: occasional   . Drug use: No  . Sexual activity: Not on file  Lifestyle  . Physical activity    Days per week: Not on file    Minutes per session: Not on file  . Stress: Not on file  Relationships  . Social Herbalist on phone: Not on file    Gets together: Not on file    Attends religious service: Not on file    Active member of club or organization: Not on file    Attends meetings of clubs or organizations: Not on file    Relationship status: Not on file  . Intimate partner violence    Fear of current or ex partner: Not on file    Emotionally abused: Not on file    Physically abused: Not on file    Forced sexual activity: Not on file  Other Topics Concern  . Not  on file  Social History Narrative  . Not on file     Constitutional: Pt reports intermittent headaches. Denies fever, malaise, fatigue, or abrupt weight changes.  HEENT: Denies eye pain, eye redness, ear pain, ringing in the ears, wax buildup, runny nose, nasal congestion, bloody nose, or sore throat. Respiratory: Denies difficulty breathing, shortness of breath, cough or sputum production.   Cardiovascular: Denies chest pain, chest tightness, palpitations or swelling in the hands or feet.  Gastrointestinal: Denies abdominal pain, bloating, constipation, diarrhea or blood in the stool.  GU: Denies urgency, frequency, pain with urination, burning sensation, blood in urine, odor or discharge. Musculoskeletal: Denies decrease in range of motion, difficulty with gait, muscle pain or joint pain and swelling.   Skin: Denies redness, rashes, lesions or ulcercations.  Neurological: Denies dizziness, difficulty with memory, difficulty with speech or problems with balance and coordination.  Psych: Denies anxiety, depression, SI/HI.  No other specific complaints in a complete review of systems (except as listed in HPI above).  Objective:   Physical Exam  BP 110/76   Pulse 65   Temp 97.9 F (36.6 C) (Temporal)   Ht 5' 5.25" (1.657 m)   Wt 168 lb 12 oz (76.5 kg)   LMP 11/12/2018   SpO2 98%   BMI 27.87 kg/m   Wt Readings from Last 3 Encounters:  10/06/18 166 lb (75.3 kg)  09/28/18 169 lb (76.7 kg)  09/26/18 168 lb (76.2 kg)    General: Appears her stated age, well developed in NAD. Skin: Warm, dry and intact. No rashesnoted. HEENT: Head: normal shape and size; Eyes: sclera white, no icterus, conjunctiva pink and EOMs intact Neck:  Neck supple, trachea midline. No masses, lumps or thyromegaly present.  Cardiovascular: Normal rate and rhythm. S1,S2 noted.  No murmur, rubs or gallops noted. No JVD or BLE edema. No carotid bruits noted. Pulmonary/Chest: Normal effort and positive vesicular breath sounds. No respiratory distress. No wheezes, rales or ronchi noted.  Abdomen: Soft and nontender. Normal bowel sounds. No distention or masses noted. Liver, spleen and kidneys non palpable. Musculoskeletal: Strength 5/5 BUE/BLE. No difficulty with gait.  Neurological: Alert and oriented. Cranial nerves II-XII grossly intact. Coordination normal.  Psychiatric: Mood and affect normal. Behavior is normal. Judgment and thought content normal.     BMET    Component Value Date/Time   NA 136 04/06/2017 0947   K 3.8 04/06/2017 0947   CL 102 04/06/2017 0947   CO2 29 04/06/2017 0947   GLUCOSE 100 (H) 04/06/2017 0947   BUN 21 04/06/2017 0947   CREATININE 0.61 04/06/2017 0947   CALCIUM 9.7 04/06/2017 0947    Lipid Panel     Component Value Date/Time   CHOL 152 04/06/2017 0947   TRIG 90.0 04/06/2017  0947   HDL 51.40 04/06/2017 0947   CHOLHDL 3 04/06/2017 0947   VLDL 18.0 04/06/2017 0947   LDLCALC 82 04/06/2017 0947    CBC    Component Value Date/Time   WBC 6.2 04/06/2017 0947   RBC 4.85 04/06/2017 0947   HGB 14.6 04/06/2017 0947   HCT 43.3 04/06/2017 0947   PLT 244.0 04/06/2017 0947   MCV 89.3 04/06/2017 0947   MCHC 33.7 04/06/2017 0947   RDW 13.4 04/06/2017 0947    Hgb A1C No results found for: HGBA1C         Assessment & Plan:  Preventative Health Maintenance:  Flu shot UTD Tetanus UTD Pap Smear due 2020 Mammogram: UTD Will refer to GI for screening  colonoscopy Continue to eat a healthy and balanced diet Will get CBC, CMET, Lipid panel and Vit D  RTC in 1 year, sooner if needed Webb Silversmith, NP This visit occurred during the SARS-CoV-2 public health emergency.  Safety protocols were in place, including screening questions prior to the visit, additional usage of staff PPE, and extensive cleaning of exam room while observing appropriate contact time as indicated for disinfecting solutions.

## 2018-11-29 NOTE — Patient Instructions (Signed)
Health Maintenance, Female Adopting a healthy lifestyle and getting preventive care are important in promoting health and wellness. Ask your health care provider about:  The right schedule for you to have regular tests and exams.  Things you can do on your own to prevent diseases and keep yourself healthy. What should I know about diet, weight, and exercise? Eat a healthy diet   Eat a diet that includes plenty of vegetables, fruits, low-fat dairy products, and lean protein.  Do not eat a lot of foods that are high in solid fats, added sugars, or sodium. Maintain a healthy weight Body mass index (BMI) is used to identify weight problems. It estimates body fat based on height and weight. Your health care provider can help determine your BMI and help you achieve or maintain a healthy weight. Get regular exercise Get regular exercise. This is one of the most important things you can do for your health. Most adults should:  Exercise for at least 150 minutes each week. The exercise should increase your heart rate and make you sweat (moderate-intensity exercise).  Do strengthening exercises at least twice a week. This is in addition to the moderate-intensity exercise.  Spend less time sitting. Even light physical activity can be beneficial. Watch cholesterol and blood lipids Have your blood tested for lipids and cholesterol at 50 years of age, then have this test every 5 years. Have your cholesterol levels checked more often if:  Your lipid or cholesterol levels are high.  You are older than 50 years of age.  You are at high risk for heart disease. What should I know about cancer screening? Depending on your health history and family history, you may need to have cancer screening at various ages. This may include screening for:  Breast cancer.  Cervical cancer.  Colorectal cancer.  Skin cancer.  Lung cancer. What should I know about heart disease, diabetes, and high blood  pressure? Blood pressure and heart disease  High blood pressure causes heart disease and increases the risk of stroke. This is more likely to develop in people who have high blood pressure readings, are of African descent, or are overweight.  Have your blood pressure checked: ? Every 3-5 years if you are 18-39 years of age. ? Every year if you are 40 years old or older. Diabetes Have regular diabetes screenings. This checks your fasting blood sugar level. Have the screening done:  Once every three years after age 40 if you are at a normal weight and have a low risk for diabetes.  More often and at a younger age if you are overweight or have a high risk for diabetes. What should I know about preventing infection? Hepatitis B If you have a higher risk for hepatitis B, you should be screened for this virus. Talk with your health care provider to find out if you are at risk for hepatitis B infection. Hepatitis C Testing is recommended for:  Everyone born from 1945 through 1965.  Anyone with known risk factors for hepatitis C. Sexually transmitted infections (STIs)  Get screened for STIs, including gonorrhea and chlamydia, if: ? You are sexually active and are younger than 50 years of age. ? You are older than 50 years of age and your health care provider tells you that you are at risk for this type of infection. ? Your sexual activity has changed since you were last screened, and you are at increased risk for chlamydia or gonorrhea. Ask your health care provider if   you are at risk.  Ask your health care provider about whether you are at high risk for HIV. Your health care provider may recommend a prescription medicine to help prevent HIV infection. If you choose to take medicine to prevent HIV, you should first get tested for HIV. You should then be tested every 3 months for as long as you are taking the medicine. Pregnancy  If you are about to stop having your period (premenopausal) and  you may become pregnant, seek counseling before you get pregnant.  Take 400 to 800 micrograms (mcg) of folic acid every day if you become pregnant.  Ask for birth control (contraception) if you want to prevent pregnancy. Osteoporosis and menopause Osteoporosis is a disease in which the bones lose minerals and strength with aging. This can result in bone fractures. If you are 65 years old or older, or if you are at risk for osteoporosis and fractures, ask your health care provider if you should:  Be screened for bone loss.  Take a calcium or vitamin D supplement to lower your risk of fractures.  Be given hormone replacement therapy (HRT) to treat symptoms of menopause. Follow these instructions at home: Lifestyle  Do not use any products that contain nicotine or tobacco, such as cigarettes, e-cigarettes, and chewing tobacco. If you need help quitting, ask your health care provider.  Do not use street drugs.  Do not share needles.  Ask your health care provider for help if you need support or information about quitting drugs. Alcohol use  Do not drink alcohol if: ? Your health care provider tells you not to drink. ? You are pregnant, may be pregnant, or are planning to become pregnant.  If you drink alcohol: ? Limit how much you use to 0-1 drink a day. ? Limit intake if you are breastfeeding.  Be aware of how much alcohol is in your drink. In the [place] one drink equals one 12 oz bottle of beer (355 mL), one 5 oz glass of wine (148 mL), or one 1 oz glass of hard liquor (44 mL). General instructions  Schedule regular health, dental, and eye exams.  Stay current with your vaccines.  Tell your health care provider if: ? You often feel depressed. ? You have ever been abused or do not feel safe at home. Summary  Adopting a healthy lifestyle and getting preventive care are important in promoting health and wellness.  Follow your health care provider's instructions about healthy  diet, exercising, and getting tested or screened for diseases.  Follow your health care provider's instructions on monitoring your cholesterol and blood pressure. This information is not intended to replace advice given to you by your health care provider. Make sure you discuss any questions you have with your health care provider. Document Released: 07/07/2010 Document Revised: 12/15/2017 Document Reviewed: 12/15/2017 Elsevier Patient Education  2020 [employer] Inc[employer]  

## 2018-11-29 NOTE — Assessment & Plan Note (Addendum)
Denies new issues or concerns Continue taking OCP's, Fioricet and Excedrin Migraine as needed

## 2018-12-06 ENCOUNTER — Encounter: Payer: Self-pay | Admitting: Gastroenterology

## 2018-12-20 ENCOUNTER — Other Ambulatory Visit: Payer: Self-pay | Admitting: Internal Medicine

## 2018-12-21 MED ORDER — NORGESTIM-ETH ESTRAD TRIPHASIC 0.18/0.215/0.25 MG-35 MCG PO TABS: 1.0000 | ORAL_TABLET | Freq: Every day | ORAL | 2 refills | Status: DC

## 2018-12-21 NOTE — Addendum Note (Signed)
Addended by: Lurlean Nanny on: 12/21/2018 11:53 AM   Modules accepted: Orders

## 2018-12-28 ENCOUNTER — Other Ambulatory Visit: Payer: Self-pay

## 2018-12-28 ENCOUNTER — Ambulatory Visit (AMBULATORY_SURGERY_CENTER): Payer: BC Managed Care – PPO

## 2018-12-28 VITALS — Temp 97.8°F | Ht 65.25 in | Wt 174.2 lb

## 2018-12-28 DIAGNOSIS — Z1211 Encounter for screening for malignant neoplasm of colon: Secondary | ICD-10-CM

## 2018-12-28 DIAGNOSIS — Z1159 Encounter for screening for other viral diseases: Secondary | ICD-10-CM

## 2018-12-28 MED ORDER — NA SULFATE-K SULFATE-MG SULF 17.5-3.13-1.6 GM/177ML PO SOLN: 1.0000 | Freq: Once | ORAL | 0 refills | Status: AC

## 2018-12-28 NOTE — Progress Notes (Signed)
No egg or soy allergy known to patient  No issues with past sedation with any surgeries  or procedures, no intubation problems  No diet pills per patient No home 02 use per patient  No blood thinners per patient  Pt denies issues with constipation  No A fib or A flutter  EMMI video sent to pt's e mail  Suprep coupon given.  Due to the COVID-19 pandemic we are asking patients to follow these guidelines. Please only bring one care partner. Please be aware that your care partner may wait in the car in the parking lot or if they feel like they will be too hot to wait in the car, they may wait in the lobby on the 4th floor. All care partners are required to wear a mask the entire time (we do not have any that we can provide them), they need to practice social distancing, and we will do a Covid check for all patient's and care partners when you arrive. Also we will check their temperature and your temperature. If the care partner waits in their car they need to stay in the parking lot the entire time and we will call them on their cell phone when the patient is ready for discharge so they can bring the car to the front of the building. Also all patient's will need to wear a mask into building.

## 2019-01-09 ENCOUNTER — Encounter: Payer: Self-pay | Admitting: Gastroenterology

## 2019-01-10 ENCOUNTER — Other Ambulatory Visit: Payer: Self-pay | Admitting: Gastroenterology

## 2019-01-10 ENCOUNTER — Ambulatory Visit (INDEPENDENT_AMBULATORY_CARE_PROVIDER_SITE_OTHER): Payer: BC Managed Care – PPO

## 2019-01-10 DIAGNOSIS — Z1159 Encounter for screening for other viral diseases: Secondary | ICD-10-CM

## 2019-01-11 LAB — SARS CORONAVIRUS 2 (TAT 6-24 HRS): SARS Coronavirus 2: NEGATIVE

## 2019-01-13 ENCOUNTER — Ambulatory Visit (AMBULATORY_SURGERY_CENTER): Payer: BC Managed Care – PPO | Admitting: Gastroenterology

## 2019-01-13 ENCOUNTER — Other Ambulatory Visit: Payer: Self-pay

## 2019-01-13 ENCOUNTER — Encounter: Payer: Self-pay | Admitting: Gastroenterology

## 2019-01-13 VITALS — BP 135/73 | HR 59 | Temp 98.3°F | Resp 18 | Ht 65.0 in | Wt 174.2 lb

## 2019-01-13 DIAGNOSIS — Z1211 Encounter for screening for malignant neoplasm of colon: Secondary | ICD-10-CM

## 2019-01-13 DIAGNOSIS — K635 Polyp of colon: Secondary | ICD-10-CM

## 2019-01-13 DIAGNOSIS — D125 Benign neoplasm of sigmoid colon: Secondary | ICD-10-CM

## 2019-01-13 MED ORDER — SODIUM CHLORIDE 0.9 % IV SOLN: 500.0000 mL | Freq: Once | INTRAVENOUS | Status: DC

## 2019-01-13 NOTE — Progress Notes (Signed)
Temp CH  VS KA  Pt's states no medical or surgical changes since previsit or office visit.

## 2019-01-13 NOTE — Op Note (Addendum)
[place] Patient Name: Alyssa Bonilla Procedure Date: 01/13/2019 8:10 AM MRN: [id number] Endoscopist: Thornton Park MD, MD Age: 49 Referring MD:  Date of Birth: [birth year] Gender: Female Account #: [id number] Procedure:                Colonoscopy Indications:              Screening for colorectal malignant neoplasm, This                            is the patient's first colonoscopy                           No known family history of colon cancer or polyps                           No baseline GI symptoms Medicines:                Monitored Anesthesia Care Procedure:                Pre-Anesthesia Assessment:                           - Prior to the procedure, a History and Physical                            was performed, and patient medications and                            allergies were reviewed. The patient's tolerance of                            previous anesthesia was also reviewed. The risks                            and benefits of the procedure and the sedation                            options and risks were discussed with the patient.                            All questions were answered, and informed consent                            was obtained. Prior Anticoagulants: The patient has                            taken no previous anticoagulant or antiplatelet                            agents. ASA Grade Assessment: II - A patient with                            mild systemic disease. After reviewing the risks  and benefits, the patient was deemed in                            satisfactory condition to undergo the procedure.                           After obtaining informed consent, the colonoscope                            was passed under direct vision. Throughout the                            procedure, the patient's blood pressure, pulse, and                            oxygen saturations were monitored continuously. The                            Colonoscope was introduced through the anus and                            advanced to the 3 cm into the ileum. A second                            forward view of the right colon was performed. The                            colonoscopy was performed without difficulty. The                            patient tolerated the procedure well. The quality                            of the bowel preparation was excellent. The                            terminal ileum, ileocecal valve, appendiceal                            orifice, and rectum were photographed. Scope In: 8:13:50 AM Scope Out: 8:27:13 AM Scope Withdrawal Time: 0 hours 11 minutes 33 seconds  Total Procedure Duration: 0 hours 13 minutes 23 seconds  Findings:                 The perianal and digital rectal examinations were                            normal. Small internal hemorrhoids were seen on                            retroflexed views of the rectum.                           A 1 mm polyp was found in the sigmoid colon. The  polyp was flat. The polyp was removed with a cold                            snare. Resection and retrieval were complete.                            Estimated blood loss was minimal.                           The exam was otherwise without abnormality on                            direct and retroflexion views. Complications:            No immediate complications. Estimated blood loss:                            Minimal. Estimated Blood Loss:     Estimated blood loss was minimal. Impression:               - One 1 mm polyp in the sigmoid colon, removed with                            a cold snare. Resected and retrieved.                           - Small internal hemorrhoids.                           - The examination was otherwise normal on direct                            and retroflexion views. Recommendation:           - Patient has a contact  number available for                            emergencies. The signs and symptoms of potential                            delayed complications were discussed with the                            patient. Return to normal activities tomorrow.                            Written discharge instructions were provided to the                            patient.                           - Resume previous diet today.                           - Continue present medications.                           -  Await pathology results.                           - Repeat colonoscopy date to be determined after                            pending pathology results are reviewed for                            surveillance. Thornton Park MD, MD 01/13/2019 8:33:00 AM This report has been signed electronically.

## 2019-01-13 NOTE — Progress Notes (Signed)
Called to room to assist during endoscopic procedure.  Patient ID and intended procedure confirmed with present staff. Received instructions for my participation in the procedure from the performing physician.  

## 2019-01-13 NOTE — Patient Instructions (Signed)
Handouts given on polyps and hemorrhoids.   YOU HAD AN ENDOSCOPIC PROCEDURE TODAY AT [facility]:   Refer to the procedure report that was given to you for any specific questions about what was found during the examination.  If the procedure report does not answer your questions, please call your gastroenterologist to clarify.  If you requested that your care partner not be given the details of your procedure findings, then the procedure report has been included in a sealed envelope for you to review at your convenience later.  YOU SHOULD EXPECT: Some feelings of bloating in the abdomen. Passage of more gas than usual.  Walking can help get rid of the air that was put into your GI tract during the procedure and reduce the bloating. If you had a lower endoscopy (such as a colonoscopy or flexible sigmoidoscopy) you may notice spotting of blood in your stool or on the toilet paper. If you underwent a bowel prep for your procedure, you may not have a normal bowel movement for a few days.  Please Note:  You might notice some irritation and congestion in your nose or some drainage.  This is from the oxygen used during your procedure.  There is no need for concern and it should clear up in a day or so.  SYMPTOMS TO REPORT IMMEDIATELY:   Following lower endoscopy (colonoscopy or flexible sigmoidoscopy):  Excessive amounts of blood in the stool  Significant tenderness or worsening of abdominal pains  Swelling of the abdomen that is new, acute  Fever of 100F or higher  For urgent or emergent issues, a gastroenterologist can be reached at any hour by calling [phone].   DIET:  We do recommend a small meal at first, but then you may proceed to your regular diet.  Drink plenty of fluids but you should avoid alcoholic beverages for 24 hours.  ACTIVITY:  You should plan to take it easy for the rest of today and you should NOT DRIVE or use heavy machinery until tomorrow (because of the  sedation medicines used during the test).    FOLLOW UP: Our staff will call the number listed on your records 48-72 hours following your procedure to check on you and address any questions or concerns that you may have regarding the information given to you following your procedure. If we do not reach you, we will leave a message.  We will attempt to reach you two times.  During this call, we will ask if you have developed any symptoms of COVID 19. If you develop any symptoms (ie: fever, flu-like symptoms, shortness of breath, cough etc.) before then, please call [phone].  If you test positive for Covid 19 in the 2 weeks post procedure, please call and report this information to [place].    If any biopsies were taken you will be contacted by phone or by letter within the next 1-3 weeks.  Please call us at [phone] if you have not heard about the biopsies in 3 weeks.    SIGNATURES/CONFIDENTIALITY: You and/or your care partner have signed paperwork which will be entered into your electronic medical record.  These signatures attest to the fact that that the information above on your After Visit Summary has been reviewed and is understood.  Full responsibility of the confidentiality of this discharge information lies with you and/or your care-partner.

## 2019-01-13 NOTE — Progress Notes (Signed)
Report to PACU, RN, vss, BBS= Clear.  

## 2019-01-17 ENCOUNTER — Telehealth: Payer: Self-pay | Admitting: *Deleted

## 2019-01-17 NOTE — Telephone Encounter (Signed)
  Follow up Call-  Call back number 01/13/2019  Post procedure Call Back phone  # [phone]  Permission to leave phone message Yes  Some recent data might be hidden     Patient questions:  Do you have a fever, pain , or abdominal swelling? No. Pain Score  0 *  Have you tolerated food without any problems? Yes.    Have you been able to return to your normal activities? Yes.    Do you have any questions about your discharge instructions: Diet   No. Medications  No. Follow up visit  No.  Do you have questions or concerns about your Care? No.  Actions: * If pain score is 4 or above: No action needed, pain <4.  1. Have you developed a fever since your procedure? no  2.   Have you had an respiratory symptoms (SOB or cough) since your procedure? no  3.   Have you tested positive for COVID 19 since your procedure no  4.   Have you had any family members/close contacts diagnosed with the COVID 19 since your procedure?  no   If yes to any of these questions please route to Joylene John, RN and Alphonsa Gin, Therapist, sports.

## 2019-01-19 ENCOUNTER — Encounter: Payer: Self-pay | Admitting: Gastroenterology

## 2019-02-10 ENCOUNTER — Emergency Department
Admission: EM | Admit: 2019-02-10 | Discharge: 2019-02-10 | Disposition: A | Discharge status: Home / Self Care | Payer: BC Managed Care – PPO | Attending: Emergency Medicine | Admitting: Emergency Medicine

## 2019-02-10 ENCOUNTER — Emergency Department: Payer: BC Managed Care – PPO

## 2019-02-10 ENCOUNTER — Encounter: Payer: Self-pay | Admitting: Emergency Medicine

## 2019-02-10 ENCOUNTER — Other Ambulatory Visit: Payer: Self-pay

## 2019-02-10 DIAGNOSIS — S32591A Other specified fracture of right pubis, initial encounter for closed fracture: Secondary | ICD-10-CM | POA: Diagnosis not present

## 2019-02-10 DIAGNOSIS — S32119A Unspecified Zone I fracture of sacrum, initial encounter for closed fracture: Secondary | ICD-10-CM | POA: Diagnosis not present

## 2019-02-10 DIAGNOSIS — S3210XA Unspecified fracture of sacrum, initial encounter for closed fracture: Secondary | ICD-10-CM | POA: Diagnosis not present

## 2019-02-10 DIAGNOSIS — S32511A Fracture of superior rim of right pubis, initial encounter for closed fracture: Secondary | ICD-10-CM | POA: Diagnosis not present

## 2019-02-10 DIAGNOSIS — S35401A Unspecified injury of right renal artery, initial encounter: Secondary | ICD-10-CM | POA: Diagnosis not present

## 2019-02-10 DIAGNOSIS — R188 Other ascites: Secondary | ICD-10-CM | POA: Diagnosis not present

## 2019-02-10 DIAGNOSIS — I10 Essential (primary) hypertension: Secondary | ICD-10-CM | POA: Diagnosis not present

## 2019-02-10 DIAGNOSIS — S32411A Displaced fracture of anterior wall of right acetabulum, initial encounter for closed fracture: Secondary | ICD-10-CM | POA: Diagnosis not present

## 2019-02-10 DIAGNOSIS — S199XXA Unspecified injury of neck, initial encounter: Secondary | ICD-10-CM | POA: Diagnosis not present

## 2019-02-10 DIAGNOSIS — Y9389 Activity, other specified: Secondary | ICD-10-CM | POA: Insufficient documentation

## 2019-02-10 DIAGNOSIS — Z720 Tobacco use: Secondary | ICD-10-CM | POA: Diagnosis not present

## 2019-02-10 DIAGNOSIS — S2241XA Multiple fractures of ribs, right side, initial encounter for closed fracture: Secondary | ICD-10-CM

## 2019-02-10 DIAGNOSIS — R519 Headache, unspecified: Secondary | ICD-10-CM | POA: Diagnosis not present

## 2019-02-10 DIAGNOSIS — S3282XA Multiple fractures of pelvis without disruption of pelvic ring, initial encounter for closed fracture: Secondary | ICD-10-CM | POA: Diagnosis not present

## 2019-02-10 DIAGNOSIS — Y998 Other external cause status: Secondary | ICD-10-CM | POA: Diagnosis not present

## 2019-02-10 DIAGNOSIS — Z20822 Contact with and (suspected) exposure to covid-19: Secondary | ICD-10-CM | POA: Insufficient documentation

## 2019-02-10 DIAGNOSIS — S301XXA Contusion of abdominal wall, initial encounter: Secondary | ICD-10-CM | POA: Diagnosis not present

## 2019-02-10 DIAGNOSIS — R935 Abnormal findings on diagnostic imaging of other abdominal regions, including retroperitoneum: Secondary | ICD-10-CM | POA: Diagnosis not present

## 2019-02-10 DIAGNOSIS — Z6828 Body mass index (BMI) 28.0-28.9, adult: Secondary | ICD-10-CM | POA: Diagnosis not present

## 2019-02-10 DIAGNOSIS — R279 Unspecified lack of coordination: Secondary | ICD-10-CM | POA: Diagnosis not present

## 2019-02-10 DIAGNOSIS — S32491A Other specified fracture of right acetabulum, initial encounter for closed fracture: Secondary | ICD-10-CM | POA: Diagnosis not present

## 2019-02-10 DIAGNOSIS — S32401S Unspecified fracture of right acetabulum, sequela: Secondary | ICD-10-CM | POA: Diagnosis not present

## 2019-02-10 DIAGNOSIS — S32501A Unspecified fracture of right pubis, initial encounter for closed fracture: Secondary | ICD-10-CM | POA: Diagnosis not present

## 2019-02-10 DIAGNOSIS — S0990XA Unspecified injury of head, initial encounter: Secondary | ICD-10-CM | POA: Insufficient documentation

## 2019-02-10 DIAGNOSIS — S7002XA Contusion of left hip, initial encounter: Secondary | ICD-10-CM | POA: Diagnosis not present

## 2019-02-10 DIAGNOSIS — Z743 Need for continuous supervision: Secondary | ICD-10-CM | POA: Diagnosis not present

## 2019-02-10 DIAGNOSIS — Z03818 Encounter for observation for suspected exposure to other biological agents ruled out: Secondary | ICD-10-CM | POA: Diagnosis not present

## 2019-02-10 DIAGNOSIS — Y92481 Parking lot as the place of occurrence of the external cause: Secondary | ICD-10-CM | POA: Insufficient documentation

## 2019-02-10 DIAGNOSIS — S7001XA Contusion of right hip, initial encounter: Secondary | ICD-10-CM | POA: Diagnosis not present

## 2019-02-10 DIAGNOSIS — S32401A Unspecified fracture of right acetabulum, initial encounter for closed fracture: Secondary | ICD-10-CM | POA: Diagnosis not present

## 2019-02-10 DIAGNOSIS — T1490XA Injury, unspecified, initial encounter: Secondary | ICD-10-CM | POA: Diagnosis not present

## 2019-02-10 DIAGNOSIS — S32810A Multiple fractures of pelvis with stable disruption of pelvic ring, initial encounter for closed fracture: Secondary | ICD-10-CM | POA: Diagnosis not present

## 2019-02-10 DIAGNOSIS — Z79899 Other long term (current) drug therapy: Secondary | ICD-10-CM | POA: Insufficient documentation

## 2019-02-10 DIAGNOSIS — S299XXA Unspecified injury of thorax, initial encounter: Secondary | ICD-10-CM | POA: Diagnosis not present

## 2019-02-10 LAB — RESPIRATORY PANEL BY RT PCR (FLU A&B, COVID)
Influenza A by PCR: NEGATIVE
Influenza B by PCR: NEGATIVE
SARS Coronavirus 2 by RT PCR: NEGATIVE

## 2019-02-10 LAB — COMPREHENSIVE METABOLIC PANEL
ALT: 120 U/L — ABNORMAL HIGH (ref 0–44)
AST: 131 U/L — ABNORMAL HIGH (ref 15–41)
Albumin: 3.7 g/dL (ref 3.5–5.0)
Alkaline Phosphatase: 41 U/L (ref 38–126)
Anion gap: 8 (ref 5–15)
BUN: 20 mg/dL (ref 6–20)
CO2: 25 mmol/L (ref 22–32)
Calcium: 9 mg/dL (ref 8.9–10.3)
Chloride: 103 mmol/L (ref 98–111)
Creatinine, Ser: 0.82 mg/dL (ref 0.44–1.00)
GFR calc Af Amer: >60 mL/min (ref >60)
GFR calc non Af Amer: >60 mL/min (ref >60)
Glucose, Bld: 138 mg/dL — ABNORMAL HIGH (ref 70–99)
Potassium: 3.2 mmol/L — ABNORMAL LOW (ref 3.5–5.1)
Sodium: 136 mmol/L (ref 135–145)
Total Bilirubin: 0.3 mg/dL (ref 0.3–1.2)
Total Protein: 7 g/dL (ref 6.5–8.1)

## 2019-02-10 LAB — CBC
HCT: 40 % (ref 36.0–46.0)
Hemoglobin: 13.6 g/dL (ref 12.0–15.0)
MCH: 29.6 pg (ref 26.0–34.0)
MCHC: 34 g/dL (ref 30.0–36.0)
MCV: 87 fL (ref 80.0–100.0)
Platelets: 235 10*3/uL (ref 150–400)
RBC: 4.6 MIL/uL (ref 3.87–5.11)
RDW: 13 % (ref 11.5–15.5)
WBC: 12.8 10*3/uL — ABNORMAL HIGH (ref 4.0–10.5)
nRBC: 0 % (ref 0.0–0.2)

## 2019-02-10 LAB — TYPE AND SCREEN
ABO/RH(D): A POS
Antibody Screen: NEGATIVE

## 2019-02-10 MED ORDER — IOHEXOL 300 MG/ML  SOLN
100.0000 mL | Freq: Once | INTRAMUSCULAR | Status: AC | PRN
  Administered 2019-02-10: 100 mL via INTRAVENOUS
  Filled 2019-02-10: qty 100

## 2019-02-10 MED ORDER — ONDANSETRON HCL 4 MG/2ML IJ SOLN
4.0000 mg | Freq: Once | INTRAMUSCULAR | Status: AC
  Administered 2019-02-10: 4 mg via INTRAVENOUS

## 2019-02-10 MED ORDER — IOHEXOL 350 MG/ML SOLN
100.0000 mL | Freq: Once | INTRAVENOUS | Status: AC | PRN
  Administered 2019-02-10: 100 mL via INTRAVENOUS
  Filled 2019-02-10: qty 100

## 2019-02-10 MED ORDER — LACTATED RINGERS IV BOLUS
1000.0000 mL | Freq: Once | INTRAVENOUS | Status: AC
  Administered 2019-02-10: 1000 mL via INTRAVENOUS

## 2019-02-10 MED ORDER — MORPHINE SULFATE (PF) 4 MG/ML IV SOLN
INTRAVENOUS | Status: AC
  Administered 2019-02-10: 4 mg
  Filled 2019-02-10: qty 1

## 2019-02-10 MED ORDER — ONDANSETRON HCL 4 MG/2ML IJ SOLN
INTRAMUSCULAR | Status: AC
  Filled 2019-02-10: qty 2
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
## 2019-02-10 NOTE — ED Notes (Signed)
Pt attempted to sign transfer consent form signature pad did not work in Midwife administered medication for pain and EMS arrive to transport

## 2019-02-10 NOTE — ED Notes (Signed)
EMTALA reviewed by charge RN 

## 2019-02-12 MED ORDER — GENERIC EXTERNAL MEDICATION
Status: DC
Start: ? — End: 2019-02-12

## 2019-02-12 MED ORDER — POLYETHYLENE GLYCOL 3350 17 GM/SCOOP PO POWD
17.00 | ORAL | Status: DC
Start: 2019-02-13 — End: 2019-02-12

## 2019-02-12 MED ORDER — ACETAMINOPHEN 500 MG PO TABS
1000.00 | ORAL_TABLET | ORAL | Status: DC
Start: 2019-02-12 — End: 2019-02-12

## 2019-02-12 MED ORDER — DSS 100 MG PO CAPS
100.00 | ORAL_CAPSULE | ORAL | Status: DC
Start: 2019-02-12 — End: 2019-02-12

## 2019-02-12 MED ORDER — ENOXAPARIN SODIUM 40 MG/0.4ML ~~LOC~~ SOLN
40.00 | SUBCUTANEOUS | Status: DC
Start: 2019-02-13 — End: 2019-02-12

## 2019-02-12 MED ORDER — CYCLOBENZAPRINE HCL 10 MG PO TABS
5.00 | ORAL_TABLET | ORAL | Status: DC
Start: ? — End: 2019-02-12

## 2019-02-21 ENCOUNTER — Encounter: Payer: Self-pay | Admitting: Internal Medicine

## 2019-02-22 ENCOUNTER — Ambulatory Visit (INDEPENDENT_AMBULATORY_CARE_PROVIDER_SITE_OTHER): Payer: BC Managed Care – PPO | Admitting: Internal Medicine

## 2019-02-22 ENCOUNTER — Encounter: Payer: Self-pay | Admitting: Internal Medicine

## 2019-02-22 DIAGNOSIS — S3289XD Fracture of other parts of pelvis, subsequent encounter for fracture with routine healing: Secondary | ICD-10-CM

## 2019-02-22 DIAGNOSIS — S32401D Unspecified fracture of right acetabulum, subsequent encounter for fracture with routine healing: Secondary | ICD-10-CM

## 2019-02-22 DIAGNOSIS — S2241XD Multiple fractures of ribs, right side, subsequent encounter for fracture with routine healing: Secondary | ICD-10-CM | POA: Diagnosis not present

## 2019-02-22 DIAGNOSIS — S35401D Unspecified injury of right renal artery, subsequent encounter: Secondary | ICD-10-CM | POA: Diagnosis not present
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
## 2019-02-22 NOTE — Patient Instructions (Signed)
Hip Exercises Ask your health care provider which exercises are safe for you. Do exercises exactly as told by your health care provider and adjust them as directed. It is normal to feel mild stretching, pulling, tightness, or discomfort as you do these exercises. Stop right away if you feel sudden pain or your pain gets worse. Do not begin these exercises until told by your health care provider. Stretching and range-of-motion exercises These exercises warm up your muscles and joints and improve the movement and flexibility of your hip. These exercises also help to relieve pain, numbness, and tingling. You may be asked to limit your range of motion if you had a hip replacement. Talk to your health care provider about these restrictions. Hamstrings, supine  1. Lie on your back (supine position). 2. Loop a belt or towel over the ball of your left / right foot. The ball of your foot is on the walking surface, right under your toes. 3. Straighten your left / right knee and slowly pull on the belt or towel to raise your leg until you feel a gentle stretch behind your knee (hamstring). ? Do not let your knee bend while you do this. ? Keep your other leg flat on the floor. 4. Hold this position for __________ seconds. 5. Slowly return your leg to the starting position. Repeat __________ times. Complete this exercise __________ times a day. Hip rotation  1. Lie on your back on a firm surface. 2. With your left / right hand, gently pull your left / right knee toward the shoulder that is on the same side of the body. Stop when your knee is pointing toward the ceiling. 3. Hold your left / right ankle with your other hand. 4. Keeping your knee steady, gently pull your left / right ankle toward your other shoulder until you feel a stretch in your buttocks. ? Keep your hips and shoulders firmly planted while you do this stretch. 5. Hold this position for __________ seconds. Repeat __________ times. Complete  this exercise __________ times a day. Seated stretch This exercise is sometimes called hamstrings and adductors stretch. 1. Sit on the floor with your legs stretched wide. Keep your knees straight during this exercise. 2. Keeping your head and back in a straight line, bend at your waist to reach for your left foot (position A). You should feel a stretch in your right inner thigh (adductors). 3. Hold this position for __________ seconds. Then slowly return to the upright position. 4. Keeping your head and back in a straight line, bend at your waist to reach forward (position B). You should feel a stretch behind both of your thighs and knees (hamstrings). 5. Hold this position for __________ seconds. Then slowly return to the upright position. 6. Keeping your head and back in a straight line, bend at your waist to reach for your right foot (position C). You should feel a stretch in your left inner thigh (adductors). 7. Hold this position for __________ seconds. Then slowly return to the upright position. Repeat __________ times. Complete this exercise __________ times a day. Lunge This exercise stretches the muscles of the hip (hip flexors). 1. Place your left / right knee on the floor and bend your other knee so that is directly over your ankle. You should be half-kneeling. 2. Keep good posture with your head over your shoulders. 3. Tighten your buttocks to point your tailbone downward. This will prevent your back from arching too much. 4. You should feel a   gentle stretch in the front of your left / right thigh and hip. If you do not feel a stretch, slide your other foot forward slightly and then slowly lunge forward with your chest up until your knee once again lines up over your ankle. ? Make sure your tailbone continues to point downward. 5. Hold this position for __________ seconds. 6. Slowly return to the starting position. Repeat __________ times. Complete this exercise __________ times a  day. Strengthening exercises These exercises build strength and endurance in your hip. Endurance is the ability to use your muscles for a long time, even after they get tired. Bridge This exercise strengthens the muscles of your hip (hip extensors). 1. Lie on your back on a firm surface with your knees bent and your feet flat on the floor. 2. Tighten your buttocks muscles and lift your bottom off the floor until the trunk of your body and your hips are level with your thighs. ? Do not arch your back. ? You should feel the muscles working in your buttocks and the back of your thighs. If you do not feel these muscles, slide your feet 1-2 inches (2.5-5 cm) farther away from your buttocks. 3. Hold this position for __________ seconds. 4. Slowly lower your hips to the starting position. 5. Let your muscles relax completely between repetitions. Repeat __________ times. Complete this exercise __________ times a day. Straight leg raises, side-lying This exercise strengthens the muscles that move the hip joint away from the center of the body (hip abductors). 1. Lie on your side with your left / right leg in the top position. Lie so your head, shoulder, hip, and knee line up. You may bend your bottom knee slightly to help you balance. 2. Roll your hips slightly forward, so your hips are stacked directly over each other and your left / right knee is facing forward. 3. Leading with your heel, lift your top leg 4-6 inches (10-15 cm). You should feel the muscles in your top hip lifting. ? Do not let your foot drift forward. ? Do not let your knee roll toward the ceiling. 4. Hold this position for __________ seconds. 5. Slowly return to the starting position. 6. Let your muscles relax completely between repetitions. Repeat __________ times. Complete this exercise __________ times a day. Straight leg raises, side-lying This exercise strengthens the muscles that move the hip joint toward the center of the  body (hip adductors). 1. Lie on your side with your left / right leg in the bottom position. Lie so your head, shoulder, hip, and knee line up. You may place your upper foot in front to help you balance. 2. Roll your hips slightly forward, so your hips are stacked directly over each other and your left / right knee is facing forward. 3. Tense the muscles in your inner thigh and lift your bottom leg 4-6 inches (10-15 cm). 4. Hold this position for __________ seconds. 5. Slowly return to the starting position. 6. Let your muscles relax completely between repetitions. Repeat __________ times. Complete this exercise __________ times a day. Straight leg raises, supine This exercise strengthens the muscles in the front of your thigh (quadriceps). 1. Lie on your back (supine position) with your left / right leg extended and your other knee bent. 2. Tense the muscles in the front of your left / right thigh. You should see your kneecap slide up or see increased dimpling just above your knee. 3. Keep these muscles tight as you raise your   leg 4-6 inches (10-15 cm) off the floor. Do not let your knee bend. 4. Hold this position for __________ seconds. 5. Keep these muscles tense as you lower your leg. 6. Relax the muscles slowly and completely between repetitions. Repeat __________ times. Complete this exercise __________ times a day. Hip abductors, standing This exercise strengthens the muscles that move the leg and hip joint away from the center of the body (hip abductors). 1. Tie one end of a rubber exercise band or tubing to a secure surface, such as a chair, table, or pole. 2. Loop the other end of the band or tubing around your left / right ankle. 3. Keeping your ankle with the band or tubing directly opposite the secured end, step away until there is tension in the tubing or band. Hold on to a chair, table, or pole as needed for balance. 4. Lift your left / right leg out to your side. While you do  this: ? Keep your back upright. ? Keep your shoulders over your hips. ? Keep your toes pointing forward. ? Make sure to use your hip muscles to slowly lift your leg. Do not tip your body or forcefully lift your leg. 5. Hold this position for __________ seconds. 6. Slowly return to the starting position. Repeat __________ times. Complete this exercise __________ times a day. Squats This exercise strengthens the muscles in the front of your thigh (quadriceps). 1. Stand in a door frame so your feet and knees are in line with the frame. You may place your hands on the frame for balance. 2. Slowly bend your knees and lower your hips like you are going to sit in a chair. ? Keep your lower legs in a straight-up-and-down position. ? Do not let your hips go lower than your knees. ? Do not bend your knees lower than told by your health care provider. ? If your hip pain increases, do not bend as low. 3. Hold this position for ___________ seconds. 4. Slowly push with your legs to return to standing. Do not use your hands to pull yourself to standing. Repeat __________ times. Complete this exercise __________ times a day. This information is not intended to replace advice given to you by your health care provider. Make sure you discuss any questions you have with your health care provider. Document Revised: 07/28/2018 Document Reviewed: 11/02/2017 Elsevier Patient Education  2020 Elsevier Inc.  

## 2019-02-28 DIAGNOSIS — M25551 Pain in right hip: Secondary | ICD-10-CM | POA: Diagnosis not present

## 2019-02-28 DIAGNOSIS — R262 Difficulty in walking, not elsewhere classified: Secondary | ICD-10-CM | POA: Diagnosis not present

## 2019-03-06 DIAGNOSIS — R262 Difficulty in walking, not elsewhere classified: Secondary | ICD-10-CM | POA: Diagnosis not present

## 2019-03-06 DIAGNOSIS — M25551 Pain in right hip: Secondary | ICD-10-CM | POA: Diagnosis not present

## 2019-03-08 DIAGNOSIS — R262 Difficulty in walking, not elsewhere classified: Secondary | ICD-10-CM | POA: Diagnosis not present

## 2019-03-08 DIAGNOSIS — M25551 Pain in right hip: Secondary | ICD-10-CM | POA: Diagnosis not present

## 2019-03-13 DIAGNOSIS — R262 Difficulty in walking, not elsewhere classified: Secondary | ICD-10-CM | POA: Diagnosis not present

## 2019-03-13 DIAGNOSIS — M25551 Pain in right hip: Secondary | ICD-10-CM | POA: Diagnosis not present

## 2019-03-15 DIAGNOSIS — R262 Difficulty in walking, not elsewhere classified: Secondary | ICD-10-CM | POA: Diagnosis not present

## 2019-03-15 DIAGNOSIS — M25551 Pain in right hip: Secondary | ICD-10-CM | POA: Diagnosis not present

## 2019-03-18 ENCOUNTER — Encounter: Payer: Self-pay | Admitting: Internal Medicine

## 2019-03-20 DIAGNOSIS — R262 Difficulty in walking, not elsewhere classified: Secondary | ICD-10-CM | POA: Diagnosis not present

## 2019-03-20 DIAGNOSIS — M25551 Pain in right hip: Secondary | ICD-10-CM | POA: Diagnosis not present

## 2019-03-22 DIAGNOSIS — R262 Difficulty in walking, not elsewhere classified: Secondary | ICD-10-CM | POA: Diagnosis not present

## 2019-03-22 DIAGNOSIS — M25551 Pain in right hip: Secondary | ICD-10-CM | POA: Diagnosis not present

## 2019-03-27 DIAGNOSIS — M25551 Pain in right hip: Secondary | ICD-10-CM | POA: Diagnosis not present

## 2019-03-27 DIAGNOSIS — R262 Difficulty in walking, not elsewhere classified: Secondary | ICD-10-CM | POA: Diagnosis not present

## 2019-03-29 DIAGNOSIS — M25551 Pain in right hip: Secondary | ICD-10-CM | POA: Diagnosis not present

## 2019-03-29 DIAGNOSIS — R262 Difficulty in walking, not elsewhere classified: Secondary | ICD-10-CM | POA: Diagnosis not present

## 2019-04-03 DIAGNOSIS — R262 Difficulty in walking, not elsewhere classified: Secondary | ICD-10-CM | POA: Diagnosis not present

## 2019-04-03 DIAGNOSIS — M25551 Pain in right hip: Secondary | ICD-10-CM | POA: Diagnosis not present

## 2019-04-05 DIAGNOSIS — R262 Difficulty in walking, not elsewhere classified: Secondary | ICD-10-CM | POA: Diagnosis not present

## 2019-04-05 DIAGNOSIS — M25551 Pain in right hip: Secondary | ICD-10-CM | POA: Diagnosis not present

## 2019-04-10 DIAGNOSIS — R262 Difficulty in walking, not elsewhere classified: Secondary | ICD-10-CM | POA: Diagnosis not present

## 2019-04-10 DIAGNOSIS — M25551 Pain in right hip: Secondary | ICD-10-CM | POA: Diagnosis not present

## 2019-04-13 DIAGNOSIS — M25551 Pain in right hip: Secondary | ICD-10-CM | POA: Diagnosis not present

## 2019-04-13 DIAGNOSIS — R262 Difficulty in walking, not elsewhere classified: Secondary | ICD-10-CM | POA: Diagnosis not present

## 2019-04-17 DIAGNOSIS — M25551 Pain in right hip: Secondary | ICD-10-CM | POA: Diagnosis not present

## 2019-04-17 DIAGNOSIS — R262 Difficulty in walking, not elsewhere classified: Secondary | ICD-10-CM | POA: Diagnosis not present

## 2019-04-19 DIAGNOSIS — M25551 Pain in right hip: Secondary | ICD-10-CM | POA: Diagnosis not present

## 2019-04-19 DIAGNOSIS — R262 Difficulty in walking, not elsewhere classified: Secondary | ICD-10-CM | POA: Diagnosis not present

## 2019-04-24 ENCOUNTER — Ambulatory Visit: Payer: BC Managed Care – PPO | Attending: Internal Medicine

## 2019-04-24 ENCOUNTER — Encounter: Payer: Self-pay | Admitting: Internal Medicine

## 2019-04-24 ENCOUNTER — Other Ambulatory Visit: Payer: Self-pay

## 2019-04-24 DIAGNOSIS — M25551 Pain in right hip: Secondary | ICD-10-CM | POA: Diagnosis not present

## 2019-04-24 DIAGNOSIS — R262 Difficulty in walking, not elsewhere classified: Secondary | ICD-10-CM | POA: Diagnosis not present

## 2019-04-24 DIAGNOSIS — Z23 Encounter for immunization: Secondary | ICD-10-CM

## 2019-04-24 NOTE — Progress Notes (Signed)
   Covid-19 Vaccination Clinic  Name:  Alyssa Bonilla    MRN: [id number] DOB: [birth year]  04/24/2019  [name] was observed post Covid-19 immunization for 15 minutes without incident. She was provided with Vaccine Information Sheet and instruction to access the V-Safe system.   [name] was instructed to call 911 with any severe reactions post vaccine: Marland Kitchen Difficulty breathing  . Swelling of face and throat  . A fast heartbeat  . A bad rash all over body  . Dizziness and weakness   Immunizations Administered    Name Date Dose VIS Date Route   Pfizer COVID-19 Vaccine 04/24/2019  8:40 AM 0.3 mL 03/01/2018 Intramuscular   Manufacturer: [employer]   Lot: [id number]   [place]: [id number]

## 2019-04-26 DIAGNOSIS — R262 Difficulty in walking, not elsewhere classified: Secondary | ICD-10-CM | POA: Diagnosis not present

## 2019-04-26 DIAGNOSIS — M25551 Pain in right hip: Secondary | ICD-10-CM | POA: Diagnosis not present

## 2019-04-27 ENCOUNTER — Ambulatory Visit (INDEPENDENT_AMBULATORY_CARE_PROVIDER_SITE_OTHER)
Admission: RE | Admit: 2019-04-27 | Discharge: 2019-04-27 | Disposition: A | Discharge status: Home / Self Care | Payer: BC Managed Care – PPO | Source: Ambulatory Visit | Attending: Internal Medicine | Admitting: Internal Medicine

## 2019-04-27 ENCOUNTER — Ambulatory Visit (INDEPENDENT_AMBULATORY_CARE_PROVIDER_SITE_OTHER): Payer: BC Managed Care – PPO | Admitting: Internal Medicine

## 2019-04-27 ENCOUNTER — Encounter: Payer: Self-pay | Admitting: Internal Medicine

## 2019-04-27 ENCOUNTER — Other Ambulatory Visit: Payer: Self-pay

## 2019-04-27 VITALS — BP 116/74 | HR 77 | Temp 97.4°F | Wt 172.0 lb

## 2019-04-27 DIAGNOSIS — M25461 Effusion, right knee: Secondary | ICD-10-CM

## 2019-04-27 DIAGNOSIS — R1031 Right lower quadrant pain: Secondary | ICD-10-CM

## 2019-04-27 DIAGNOSIS — Z8781 Personal history of (healed) traumatic fracture: Secondary | ICD-10-CM

## 2019-04-27 DIAGNOSIS — S32511D Fracture of superior rim of right pubis, subsequent encounter for fracture with routine healing: Secondary | ICD-10-CM | POA: Diagnosis not present

## 2019-04-27 DIAGNOSIS — R600 Localized edema: Secondary | ICD-10-CM

## 2019-04-27 DIAGNOSIS — S7011XA Contusion of right thigh, initial encounter: Secondary | ICD-10-CM

## 2019-04-27 DIAGNOSIS — R609 Edema, unspecified: Secondary | ICD-10-CM | POA: Diagnosis not present

## 2019-04-27 DIAGNOSIS — M25561 Pain in right knee: Secondary | ICD-10-CM | POA: Diagnosis not present

## 2019-04-27 LAB — BASIC METABOLIC PANEL
BUN: 16 mg/dL (ref 6–23)
CO2: 28 mEq/L (ref 19–32)
Calcium: 9.7 mg/dL (ref 8.4–10.5)
Chloride: 102 mEq/L (ref 96–112)
Creatinine, Ser: 0.6 mg/dL (ref 0.40–1.20)
GFR: 105.56 mL/min (ref >60.00)
Glucose, Bld: 92 mg/dL (ref 70–99)
Potassium: 4.1 mEq/L (ref 3.5–5.1)
Sodium: 136 mEq/L (ref 135–145)
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
## 2019-05-01 NOTE — Telephone Encounter (Signed)
Alyssa Bonilla with [facility] calling to let us know that they no longer take insurance. They refer all referrals to Audelia Hives, MD - Plastic Surgeon for [facility]  Will send to [facility] as [facility]

## 2019-05-02 ENCOUNTER — Encounter: Payer: Self-pay | Admitting: Internal Medicine

## 2019-05-02 DIAGNOSIS — M25551 Pain in right hip: Secondary | ICD-10-CM | POA: Diagnosis not present

## 2019-05-02 DIAGNOSIS — R262 Difficulty in walking, not elsewhere classified: Secondary | ICD-10-CM | POA: Diagnosis not present

## 2019-05-05 DIAGNOSIS — R262 Difficulty in walking, not elsewhere classified: Secondary | ICD-10-CM | POA: Diagnosis not present

## 2019-05-05 DIAGNOSIS — M25551 Pain in right hip: Secondary | ICD-10-CM | POA: Diagnosis not present

## 2019-05-16 ENCOUNTER — Ambulatory Visit: Payer: BC Managed Care – PPO | Attending: Internal Medicine

## 2019-05-16 DIAGNOSIS — Z23 Encounter for immunization: Secondary | ICD-10-CM

## 2019-05-16 NOTE — Progress Notes (Signed)
   Covid-19 Vaccination Clinic  Name:  Alyssa Bonilla    MRN: [id number] DOB: 1970/03/14  05/16/2019  Alyssa Bonilla was observed post Covid-19 immunization for 15 minutes without incident. She was provided with Vaccine Information Sheet and instruction to access the V-Safe system.   Alyssa Bonilla was instructed to call 911 with any severe reactions post vaccine: Marland Kitchen Difficulty breathing  . Swelling of face and throat  . A fast heartbeat  . A bad rash all over body  . Dizziness and weakness   Immunizations Administered    Name Date Dose VIS Date Route   Pfizer COVID-19 Vaccine 05/16/2019  8:59 AM 0.3 mL 03/01/2018 Intramuscular   Manufacturer: [place]   Lot: [id number]   [place]: [id number]

## 2019-09-26 ENCOUNTER — Ambulatory Visit: Payer: BC Managed Care – PPO | Admitting: Internal Medicine

## 2019-10-16 ENCOUNTER — Ambulatory Visit: Payer: BC Managed Care – PPO | Admitting: Internal Medicine

## 2019-12-05 ENCOUNTER — Encounter: Payer: Self-pay | Admitting: Internal Medicine

## 2019-12-05 ENCOUNTER — Other Ambulatory Visit (HOSPITAL_COMMUNITY)
Admission: RE | Admit: 2019-12-05 | Discharge: 2019-12-05 | Disposition: A | Discharge status: Home / Self Care | Payer: BC Managed Care – PPO | Source: Ambulatory Visit | Attending: Internal Medicine | Admitting: Internal Medicine

## 2019-12-05 ENCOUNTER — Ambulatory Visit (INDEPENDENT_AMBULATORY_CARE_PROVIDER_SITE_OTHER): Payer: BC Managed Care – PPO | Admitting: Internal Medicine

## 2019-12-05 ENCOUNTER — Other Ambulatory Visit: Payer: Self-pay

## 2019-12-05 VITALS — BP 124/80 | HR 75

## 2019-12-05 DIAGNOSIS — Z124 Encounter for screening for malignant neoplasm of cervix: Secondary | ICD-10-CM | POA: Diagnosis not present

## 2019-12-05 DIAGNOSIS — Z01419 Encounter for gynecological examination (general) (routine) without abnormal findings: Secondary | ICD-10-CM

## 2019-12-05 NOTE — Addendum Note (Signed)
Addended by: Lurlean Nanny on: 12/05/2019 03:24 PM   Modules accepted: Orders

## 2019-12-05 NOTE — Patient Instructions (Signed)

## 2019-12-05 NOTE — Progress Notes (Signed)
Subjective:    Patient ID: Alyssa Bonilla, female    DOB: Jun 04, 1970, 49 y.o.   MRN: [id number]  HPI  Pt presents to the clinic today for her Pap smear.  Her last Pap was 08/2014.  Review of Systems      Past Medical History:  Diagnosis Date  . Migraines     Current Outpatient Medications  Medication Sig Dispense Refill  . aspirin-acetaminophen-caffeine (EXCEDRIN MIGRAINE) 250-250-65 MG tablet Take 2 tablets by mouth every 6 (six) hours as needed for headache.    . Calcium Carbonate-Vit D-Min (CALCIUM 1200 PO) Take by mouth.    . Cholecalciferol (VITAMIN D3) 2000 units capsule Take 2,000 Units by mouth daily.    . Norgestimate-Ethinyl Estradiol Triphasic (TRI-ESTARYLLA) 0.18/0.215/0.25 MG-35 MCG tablet Take 1 tablet by mouth daily. 84 tablet 2  . triamcinolone cream (KENALOG) 0.1 % APPLY TO AFFECTED AREA TWICE A DAY 30 g 0   No current facility-administered medications for this visit.    Allergies  Allergen Reactions  . Penicillins Hives    Family History  Problem Relation Age of Onset  . Heart attack Mother   . Ovarian cancer Mother   . Stroke Father   . Hemochromatosis Sister   . Ovarian cancer Sister   . Colon cancer Neg Hx   . Colon polyps Neg Hx   . Esophageal cancer Neg Hx   . Rectal cancer Neg Hx   . Stomach cancer Neg Hx     Social History   Socioeconomic History  . Marital status: Married    Spouse name: Not on file  . Number of children: Not on file  . Years of education: Not on file  . Highest education level: Not on file  Occupational History  . Not on file  Tobacco Use  . Smoking status: Never Smoker  . Smokeless tobacco: Never Used  Vaping Use  . Vaping Use: Never used  Substance and Sexual Activity  . Alcohol use: Yes    Comment: occasional   . Drug use: No  . Sexual activity: Yes  Other Topics Concern  . Not on file  Social History Narrative  . Not on file   Social Determinants of Health   Financial Resource Strain:   .  Difficulty of Paying Living Expenses: Not on file  Food Insecurity:   . Worried About Charity fundraiser in the Last Year: Not on file  . Ran Out of Food in the Last Year: Not on file  Transportation Needs:   . Lack of Transportation (Medical): Not on file  . Lack of Transportation (Non-Medical): Not on file  Physical Activity:   . Days of Exercise per Week: Not on file  . Minutes of Exercise per Session: Not on file  Stress:   . Feeling of Stress : Not on file  Social Connections:   . Frequency of Communication with Friends and Family: Not on file  . Frequency of Social Gatherings with Friends and Family: Not on file  . Attends Religious Services: Not on file  . Active Member of Clubs or Organizations: Not on file  . Attends Archivist Meetings: Not on file  . Marital Status: Not on file  Intimate Partner Violence:   . Fear of Current or Ex-Partner: Not on file  . Emotionally Abused: Not on file  . Physically Abused: Not on file  . Sexually Abused: Not on file     Constitutional: Denies fever, malaise, fatigue, headache or  abrupt weight changes.  Respiratory: Denies difficulty breathing, shortness of breath, cough or sputum production.   Cardiovascular: Denies chest pain, chest tightness, palpitations or swelling in the hands or feet.  Gastrointestinal: Denies abdominal pain, bloating, constipation, diarrhea or blood in the stool.  GU: Denies urgency, frequency, pain with urination, burning sensation, blood in urine, odor or discharge.  No other specific complaints in a complete review of systems (except as listed in HPI above).  Objective:   Physical Exam   BP 124/80   Pulse 75   Wt Readings from Last 3 Encounters:  04/27/19 172 lb (78 kg)  02/10/19 170 lb (77.1 kg)  01/13/19 174 lb 3.2 oz (79 kg)    General: Appears her stated age, well developed, well nourished in NAD. Cardiovascular: Normal rate. Pulmonary/Chest: Normal effort. Pelvic: Normal female  anatomy. Cervix without mass or lesion. No vaginal discharge or odor present. No CMT. Adnexa non palpable. Neurological: Alert and oriented.   BMET    Component Value Date/Time   NA 136 04/27/2019 1108   K 4.1 04/27/2019 1108   CL 102 04/27/2019 1108   CO2 28 04/27/2019 1108   GLUCOSE 92 04/27/2019 1108   BUN 16 04/27/2019 1108   CREATININE 0.60 04/27/2019 1108   CALCIUM 9.7 04/27/2019 1108   GFRNONAA >60 02/10/2019 1822   GFRAA >60 02/10/2019 1822    Lipid Panel     Component Value Date/Time   CHOL 153 11/29/2018 1225   TRIG 83.0 11/29/2018 1225   HDL 50.50 11/29/2018 1225   CHOLHDL 3 11/29/2018 1225   VLDL 16.6 11/29/2018 1225   LDLCALC 86 11/29/2018 1225    CBC    Component Value Date/Time   WBC 12.8 (H) 02/10/2019 1822   RBC 4.60 02/10/2019 1822   HGB 13.6 02/10/2019 1822   HCT 40.0 02/10/2019 1822   PLT 235 02/10/2019 1822   MCV 87.0 02/10/2019 1822   MCH 29.6 02/10/2019 1822   MCHC 34.0 02/10/2019 1822   RDW 13.0 02/10/2019 1822    Hgb A1C No results found for: HGBA1C         Assessment & Plan:   Screen for Cervical Cancer with Routine GYN Exam:  Pap smear today- she declines STD screening  Make an appt for your annual exam, return precautions discussed Webb Silversmith, NP This visit occurred during the SARS-CoV-2 public health emergency.  Safety protocols were in place, including screening questions prior to the visit, additional usage of staff PPE, and extensive cleaning of exam room while observing appropriate contact time as indicated for disinfecting solutions.

## 2019-12-08 LAB — CYTOLOGY - PAP: Diagnosis: NEGATIVE

## 2019-12-26 ENCOUNTER — Other Ambulatory Visit (HOSPITAL_COMMUNITY): Payer: Self-pay | Admitting: Internal Medicine

## 2019-12-26 DIAGNOSIS — Z1231 Encounter for screening mammogram for malignant neoplasm of breast: Secondary | ICD-10-CM

## 2019-12-27 ENCOUNTER — Other Ambulatory Visit: Payer: Self-pay

## 2019-12-27 ENCOUNTER — Other Ambulatory Visit (HOSPITAL_COMMUNITY): Payer: Self-pay | Admitting: Internal Medicine

## 2019-12-27 ENCOUNTER — Ambulatory Visit (HOSPITAL_COMMUNITY)
Admission: RE | Admit: 2019-12-27 | Discharge: 2019-12-27 | Disposition: A | Discharge status: Home / Self Care | Payer: BC Managed Care – PPO | Source: Ambulatory Visit | Attending: Internal Medicine | Admitting: Internal Medicine

## 2019-12-27 DIAGNOSIS — Z1231 Encounter for screening mammogram for malignant neoplasm of breast: Secondary | ICD-10-CM | POA: Diagnosis not present

## 2020-02-19 ENCOUNTER — Ambulatory Visit (INDEPENDENT_AMBULATORY_CARE_PROVIDER_SITE_OTHER): Payer: No Typology Code available for payment source | Admitting: Internal Medicine

## 2020-02-19 ENCOUNTER — Encounter: Payer: Self-pay | Admitting: Internal Medicine

## 2020-02-19 ENCOUNTER — Other Ambulatory Visit: Payer: Self-pay

## 2020-02-19 VITALS — BP 124/76 | HR 70 | Temp 97.6°F | Ht 65.25 in | Wt 182.0 lb

## 2020-02-19 DIAGNOSIS — Z114 Encounter for screening for human immunodeficiency virus [HIV]: Secondary | ICD-10-CM

## 2020-02-19 DIAGNOSIS — Z0001 Encounter for general adult medical examination with abnormal findings: Secondary | ICD-10-CM

## 2020-02-19 DIAGNOSIS — R232 Flushing: Secondary | ICD-10-CM

## 2020-02-19 DIAGNOSIS — N926 Irregular menstruation, unspecified: Secondary | ICD-10-CM | POA: Diagnosis not present

## 2020-02-19 DIAGNOSIS — Z1159 Encounter for screening for other viral diseases: Secondary | ICD-10-CM

## 2020-02-19 NOTE — Progress Notes (Signed)
Subjective:    Patient ID: Alyssa Bonilla, female    DOB: 03/06/1970, 49 y.o.   MRN: [id number]  HPI  Pt presents to the clinic today for her annual exam.  Migraines: Currently not an issue. She is not taking anything for this.  Flu: 10/2018 Tetanus: 04/2017 Pap Smear: 11/2019 Mammogram: 12/2019 Colon Screening: 01/2019 Vision Screening: annually Dentist: biannually  Diet: She does eat meat.  She consumes fruits vegetables.  She tries to avoid fried foods. She drinks mostly water. Exercise: Treadmill.   Review of Systems      Past Medical History:  Diagnosis Date  . Migraines     Current Outpatient Medications  Medication Sig Dispense Refill  . aspirin-acetaminophen-caffeine (EXCEDRIN MIGRAINE) 250-250-65 MG tablet Take 2 tablets by mouth every 6 (six) hours as needed for headache.    . Calcium Carbonate-Vit D-Min (CALCIUM 1200 PO) Take by mouth.    . Cholecalciferol (VITAMIN D3) 2000 units capsule Take 2,000 Units by mouth daily.    . Multiple Vitamins-Minerals (THRIVE FOR LIFE WOMENS PO) Take by mouth.    . triamcinolone cream (KENALOG) 0.1 % APPLY TO AFFECTED AREA TWICE A DAY 30 g 0   No current facility-administered medications for this visit.    Allergies  Allergen Reactions  . Penicillins Hives    Family History  Problem Relation Age of Onset  . Heart attack Mother   . Ovarian cancer Mother   . Stroke Father   . Hemochromatosis Sister   . Ovarian cancer Sister   . Colon cancer Neg Hx   . Colon polyps Neg Hx   . Esophageal cancer Neg Hx   . Rectal cancer Neg Hx   . Stomach cancer Neg Hx     Social History   Socioeconomic History  . Marital status: Married    Spouse name: Not on file  . Number of children: Not on file  . Years of education: Not on file  . Highest education level: Not on file  Occupational History  . Not on file  Tobacco Use  . Smoking status: Never Smoker  . Smokeless tobacco: Never Used  Vaping Use  . Vaping Use:  Never used  Substance and Sexual Activity  . Alcohol use: Yes    Comment: occasional   . Drug use: No  . Sexual activity: Yes  Other Topics Concern  . Not on file  Social History Narrative  . Not on file   Social Determinants of Health   Financial Resource Strain: Not on file  Food Insecurity: Not on file  Transportation Needs: Not on file  Physical Activity: Not on file  Stress: Not on file  Social Connections: Not on file  Intimate Partner Violence: Not on file     Constitutional: Denies fever, malaise, fatigue, headache or abrupt weight changes.  HEENT: Denies eye pain, eye redness, ear pain, ringing in the ears, wax buildup, runny nose, nasal congestion, bloody nose, or sore throat. Respiratory: Denies difficulty breathing, shortness of breath, cough or sputum production.   Cardiovascular: Denies chest pain, chest tightness, palpitations or swelling in the hands or feet.  Gastrointestinal: Pt reports intermittent constipation. Denies abdominal pain, bloating,  diarrhea or blood in the stool.  GU: Pt reports irregular menses. Denies urgency, frequency, pain with urination, burning sensation, blood in urine, odor or discharge. Musculoskeletal: Denies decrease in range of motion, difficulty with gait, muscle pain or joint pain and swelling.  Skin: Denies redness, rashes, lesions or ulcercations.  Neurological:  Pt reports hot flashes. Denies dizziness, difficulty with memory, difficulty with speech or problems with balance and coordination.  Psych: Denies anxiety, depression, SI/HI.  No other specific complaints in a complete review of systems (except as listed in HPI above).  Objective:   Physical Exam   BP 124/76   Pulse 70   Temp 97.6 F (36.4 C) (Temporal)   Ht 5' 5.25" (1.657 m)   Wt 182 lb (82.6 kg)   SpO2 98%   BMI 30.05 kg/m   Wt Readings from Last 3 Encounters:  04/27/19 172 lb (78 kg)  02/10/19 170 lb (77.1 kg)  01/13/19 174 lb 3.2 oz (79 kg)     General: Appears her stated age, obese, in NAD. Skin: Warm, dry and intact. No rashes, lesions or ulcerations noted. HEENT: Head: normal shape and size; Eyes: sclera white, no icterus, conjunctiva pink, PERRLA and EOMs intact; Neck:  Neck supple, trachea midline. No masses, lumps or thyromegaly present.  Cardiovascular: Normal rate and rhythm. S1,S2 noted.  No murmur, rubs or gallops noted. No JVD or BLE edema. No carotid bruits noted. Pulmonary/Chest: Normal effort and positive vesicular breath sounds. No respiratory distress. No wheezes, rales or ronchi noted.  Abdomen: Soft and nontender. Normal bowel sounds. No distention or masses noted. Liver, spleen and kidneys non palpable. Musculoskeletal: Strength 5/5 BUE/BLE. No difficulty with gait.  Neurological: Alert and oriented. Cranial nerves II-XII grossly intact. Coordination normal.  Psychiatric: Mood and affect normal. Behavior is normal. Judgment and thought content normal.     BMET    Component Value Date/Time   NA 136 04/27/2019 1108   K 4.1 04/27/2019 1108   CL 102 04/27/2019 1108   CO2 28 04/27/2019 1108   GLUCOSE 92 04/27/2019 1108   BUN 16 04/27/2019 1108   CREATININE 0.60 04/27/2019 1108   CALCIUM 9.7 04/27/2019 1108   GFRNONAA >60 02/10/2019 1822   GFRAA >60 02/10/2019 1822    Lipid Panel     Component Value Date/Time   CHOL 153 11/29/2018 1225   TRIG 83.0 11/29/2018 1225   HDL 50.50 11/29/2018 1225   CHOLHDL 3 11/29/2018 1225   VLDL 16.6 11/29/2018 1225   LDLCALC 86 11/29/2018 1225    CBC    Component Value Date/Time   WBC 12.8 (H) 02/10/2019 1822   RBC 4.60 02/10/2019 1822   HGB 13.6 02/10/2019 1822   HCT 40.0 02/10/2019 1822   PLT 235 02/10/2019 1822   MCV 87.0 02/10/2019 1822   MCH 29.6 02/10/2019 1822   MCHC 34.0 02/10/2019 1822   RDW 13.0 02/10/2019 1822    Hgb A1C No results found for: HGBA1C         Assessment & Plan:  Preventative Health Maintenance:  She declines flu  shot Tetanus UTD COVID UTD Pap smear UTD Mammogram UTD Colonoscopy UTD Encouraged her to consume a balanced diet and exercise regimen Advised her to see an eye doctor and dentist annually We will check CBC, c-Met, lipid, A1c, HIV and hep C today  Irregular Menses, Hot Flashes:  Likely perimenopausal TSH today Start Black Cohosh OTC  RTC in 1 year, sooner if needed.  Webb Silversmith, NP This visit occurred during the SARS-CoV-2 public health emergency.  Safety protocols were in place, including screening questions prior to the visit, additional usage of staff PPE, and extensive cleaning of exam room while observing appropriate contact time as indicated for disinfecting solutions.

## 2020-02-20 ENCOUNTER — Encounter: Payer: Self-pay | Admitting: Internal Medicine

## 2020-02-20 LAB — COMPREHENSIVE METABOLIC PANEL
ALT: 20 U/L (ref 0–35)
AST: 16 U/L (ref 0–37)
Albumin: 4.2 g/dL (ref 3.5–5.2)
Alkaline Phosphatase: 60 U/L (ref 39–117)
BUN: 18 mg/dL (ref 6–23)
CO2: 28 mEq/L (ref 19–32)
Calcium: 9.4 mg/dL (ref 8.4–10.5)
Chloride: 104 mEq/L (ref 96–112)
Creatinine, Ser: 0.66 mg/dL (ref 0.40–1.20)
GFR: 101.57 mL/min (ref >60.00)
Glucose, Bld: 96 mg/dL (ref 70–99)
Potassium: 3.5 mEq/L (ref 3.5–5.1)
Sodium: 138 mEq/L (ref 135–145)
Total Bilirubin: 0.3 mg/dL (ref 0.2–1.2)
Total Protein: 7.3 g/dL (ref 6.0–8.3)

## 2020-02-20 LAB — LIPID PANEL
Cholesterol: 142 mg/dL (ref 0–200)
HDL: 52 mg/dL (ref >39.00)
LDL Cholesterol: 63 mg/dL (ref 0–99)
NonHDL: 90.21
Total CHOL/HDL Ratio: 3
Triglycerides: 137 mg/dL (ref 0.0–149.0)
VLDL: 27.4 mg/dL (ref 0.0–40.0)

## 2020-02-20 LAB — HEPATITIS C ANTIBODY
Hepatitis C Ab: NONREACTIVE
SIGNAL TO CUT-OFF: 0.01 (ref <1.00)

## 2020-02-20 LAB — CBC
HCT: 40.9 % (ref 36.0–46.0)
Hemoglobin: 14.4 g/dL (ref 12.0–15.0)
MCHC: 35.1 g/dL (ref 30.0–36.0)
MCV: 88.7 fl (ref 78.0–100.0)
Platelets: 208 10*3/uL (ref 150.0–400.0)
RBC: 4.62 Mil/uL (ref 3.87–5.11)
RDW: 13.5 % (ref 11.5–15.5)
WBC: 7.3 10*3/uL (ref 4.0–10.5)

## 2020-02-20 LAB — HEMOGLOBIN A1C: Hgb A1c MFr Bld: 5.1 % (ref 4.6–6.5)

## 2020-02-20 LAB — TSH: TSH: 1.88 u[IU]/mL (ref 0.35–4.50)

## 2020-02-20 LAB — HIV ANTIBODY (ROUTINE TESTING W REFLEX): HIV 1&2 Ab, 4th Generation: NONREACTIVE

## 2020-02-20 NOTE — Patient Instructions (Signed)

## 2021-03-03 ENCOUNTER — Ambulatory Visit: Payer: No Typology Code available for payment source | Admitting: Adult Health

## 2021-03-14 ENCOUNTER — Ambulatory Visit: Payer: No Typology Code available for payment source | Admitting: Family

## 2021-04-24 ENCOUNTER — Ambulatory Visit: Payer: No Typology Code available for payment source | Admitting: Family

## 2021-06-06 ENCOUNTER — Ambulatory Visit: Payer: No Typology Code available for payment source | Admitting: Family

## 2021-06-06 ENCOUNTER — Encounter: Payer: Self-pay | Admitting: Family

## 2021-06-06 ENCOUNTER — Other Ambulatory Visit: Payer: Self-pay | Admitting: Family

## 2021-06-06 VITALS — BP 112/68 | HR 63 | Temp 98.6°F | Resp 16 | Ht 65.25 in | Wt 168.2 lb

## 2021-06-06 DIAGNOSIS — E559 Vitamin D deficiency, unspecified: Secondary | ICD-10-CM

## 2021-06-06 DIAGNOSIS — G43109 Migraine with aura, not intractable, without status migrainosus: Secondary | ICD-10-CM

## 2021-06-06 DIAGNOSIS — Z1231 Encounter for screening mammogram for malignant neoplasm of breast: Secondary | ICD-10-CM | POA: Diagnosis not present

## 2021-06-06 DIAGNOSIS — L301 Dyshidrosis [pompholyx]: Secondary | ICD-10-CM | POA: Diagnosis not present

## 2021-06-06 DIAGNOSIS — N926 Irregular menstruation, unspecified: Secondary | ICD-10-CM

## 2021-06-06 DIAGNOSIS — N951 Menopausal and female climacteric states: Secondary | ICD-10-CM

## 2021-06-06 MED ORDER — PAROXETINE MESYLATE 7.5 MG PO CAPS: 7.5000 mg | ORAL_CAPSULE | Freq: Every day | ORAL | 1 refills | Status: DC

## 2021-06-06 MED ORDER — CLOBETASOL PROPIONATE 0.05 % EX CREA: 1.0000 "application " | TOPICAL_CREAM | Freq: Two times a day (BID) | CUTANEOUS | 0 refills | Status: AC

## 2021-06-06 MED ORDER — RIZATRIPTAN BENZOATE 10 MG PO TABS: 10.0000 mg | ORAL_TABLET | ORAL | 0 refills | Status: DC | PRN

## 2021-06-06 NOTE — Assessment & Plan Note (Signed)
rx triamcinolone cream 0.1%  aquaphor after applying

## 2021-06-06 NOTE — Assessment & Plan Note (Signed)
Hormone panel workup in progress pending results Likely perimenopausal

## 2021-06-06 NOTE — Assessment & Plan Note (Signed)
Trial maxalt sent to pharmacy  Avoid triggers as able Keep hydrated with water

## 2021-06-06 NOTE — Patient Instructions (Addendum)
Recommend floragen probiotic.   I sent in a new medication for your menopausal symptoms, called brisdelle.  Let me know if this is not approved   You have an appointment scheduled for: please call to schedule at below location: '[]'$   2D Mammogram  '[x]'$   3D Mammogram  '[]'$   Bone Density    '[]'$   [facility]  [place] [address]  [phone]  '[x]'$   [place] [facility])   [address] [place], [place] [address]  [phone]  '[]'$   [facility]      [address] [place], [place]         '[]'$   [place], [place]  '[]'$  [place] [facility]   [address], [place] [address]  '[]'$  [place]  [address] [place],  [phone]  Make sure to wear two piece  clothing  No lotions powders or deodorants the day of the appointment Make sure to bring picture ID and insurance card.  Bring list of medications you are currently taking including any supplements.   Welcome to our clinic, I am happy to have you as my new patient. I am excited to continue on this healthcare journey with you.  Stop by the lab prior to leaving today. I will notify you of your results once received.   Please keep in mind Any my chart messages you send have up to a three business day turnaround for a response.  Phone calls may take up to a one full business day turnaround for a  response.   If you need a medication refill I recommend you request it through the pharmacy as this is easiest for [place] rather than sending a message and or phone call.   Due to recent changes in healthcare laws, you may see results of your imaging and/or laboratory studies on MyChart before I have had a chance to  review them.  I understand that in some cases there may be results that are confusing or concerning to you. Please understand that not all results are received at the same time and often I may need to interpret multiple results in order to provide you with the best plan of care or course of treatment. Therefore, I ask that you please give me 2 business days to thoroughly review all your results before contacting my office for clarification. Should we see a critical lab result, you will be contacted sooner.   It was a pleasure seeing you today! Please do not hesitate to reach out with any questions and or concerns.  Regards,   Eugenia Pancoast FNP-C
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
## 2021-06-06 NOTE — Assessment & Plan Note (Signed)
Ordered vitamin d pending results.   

## 2021-06-06 NOTE — Assessment & Plan Note (Signed)
Trial brisdelle 7.5 mg once daily  Pt to let me know if not covered by insurance

## 2021-06-06 NOTE — Assessment & Plan Note (Signed)
Mammogram ordered. Pending results. 

## 2021-06-07 LAB — CBC WITH DIFFERENTIAL/PLATELET
Absolute Monocytes: 601 cells/uL (ref 200–950)
Basophils Absolute: 53 cells/uL (ref 0–200)
Basophils Relative: 0.8 %
Eosinophils Absolute: 251 cells/uL (ref 15–500)
Eosinophils Relative: 3.8 %
HCT: 42.2 % (ref 35.0–45.0)
Hemoglobin: 14.3 g/dL (ref 11.7–15.5)
Lymphs Abs: 2594 cells/uL (ref 850–3900)
MCH: 30.6 pg (ref 27.0–33.0)
MCHC: 33.9 g/dL (ref 32.0–36.0)
MCV: 90.2 fL (ref 80.0–100.0)
MPV: 11 fL (ref 7.5–12.5)
Monocytes Relative: 9.1 %
Neutro Abs: 3102 cells/uL (ref 1500–7800)
Neutrophils Relative %: 47 %
Platelets: 221 10*3/uL (ref 140–400)
RBC: 4.68 10*6/uL (ref 3.80–5.10)
RDW: 13.2 % (ref 11.0–15.0)
Total Lymphocyte: 39.3 %
WBC: 6.6 10*3/uL (ref 3.8–10.8)

## 2021-06-07 LAB — COMPREHENSIVE METABOLIC PANEL
AG Ratio: 1.5 (calc) (ref 1.0–2.5)
ALT: 20 U/L (ref 6–29)
AST: 18 U/L (ref 10–35)
Albumin: 4.5 g/dL (ref 3.6–5.1)
Alkaline phosphatase (APISO): 55 U/L (ref 37–153)
BUN/Creatinine Ratio: 38 (calc) — ABNORMAL HIGH (ref 6–22)
BUN: 27 mg/dL — ABNORMAL HIGH (ref 7–25)
CO2: 27 mmol/L (ref 20–32)
Calcium: 9.9 mg/dL (ref 8.6–10.4)
Chloride: 103 mmol/L (ref 98–110)
Creat: 0.72 mg/dL (ref 0.50–1.03)
Globulin: 3.1 g/dL (calc) (ref 1.9–3.7)
Glucose, Bld: 87 mg/dL (ref 65–99)
Potassium: 4 mmol/L (ref 3.5–5.3)
Sodium: 139 mmol/L (ref 135–146)
Total Bilirubin: 0.7 mg/dL (ref 0.2–1.2)
Total Protein: 7.6 g/dL (ref 6.1–8.1)

## 2021-06-07 LAB — LUTEINIZING HORMONE: LH: 7.5 m[IU]/mL

## 2021-06-07 LAB — TSH: TSH: 1.61 mIU/L

## 2021-06-07 LAB — VITAMIN D 25 HYDROXY (VIT D DEFICIENCY, FRACTURES): Vit D, 25-Hydroxy: 40 ng/mL (ref 30–100)

## 2021-06-07 LAB — FOLLICLE STIMULATING HORMONE: FSH: 19.9 m[IU]/mL

## 2021-07-25 NOTE — Telephone Encounter (Signed)
I don't think Paroxetine HCL comes in 7.5 mg.  You will probably need to proceed with PA.

## 2021-07-28 ENCOUNTER — Other Ambulatory Visit: Payer: Self-pay | Admitting: Family

## 2021-07-28 ENCOUNTER — Telehealth: Payer: Self-pay

## 2021-07-28 DIAGNOSIS — N951 Menopausal and female climacteric states: Secondary | ICD-10-CM

## 2021-07-28 MED ORDER — PAROXETINE HCL 10 MG PO TABS: 10.0000 mg | ORAL_TABLET | Freq: Every day | ORAL | 1 refills | Status: DC

## 2021-07-28 NOTE — Progress Notes (Signed)
x

## 2021-07-28 NOTE — Telephone Encounter (Signed)
Prior auth attempted for PARoxetine HCl '10MG'$  tablets. ZUNAIRA LAMY (Key: [id number])  Cover my meds states PA cannot be processed electronically.  Please contact member's prescription card at [phone] for further information.  When I called the above number, I spoke to [place].  She stated that a PA is not required for this medication.  They show that this claim has been approved for 30 tablets for 30 days.

## 2021-07-29 NOTE — Telephone Encounter (Signed)
Called pt and vm is not set up to leave a vm.

## 2021-08-05 NOTE — Telephone Encounter (Signed)
I received a prior auth denial letter for Alyssa Bonilla re:  Paroxetine prescription.  When I spoke to her insurance co earlier, they stated a prior auth was not needed.  I called [facility] to make sure patient has gotten her prescription.  It was filled on 07/31/21.  I will still send the denial letter to scanning, but patient was able to get the prescription.

## 2021-08-05 NOTE — Telephone Encounter (Signed)
Noted  

## 2021-08-22 ENCOUNTER — Other Ambulatory Visit: Payer: Self-pay | Admitting: Family

## 2021-08-22 DIAGNOSIS — N951 Menopausal and female climacteric states: Secondary | ICD-10-CM

## 2022-01-29 ENCOUNTER — Ambulatory Visit
Admission: EM | Admit: 2022-01-29 | Discharge: 2022-01-29 | Disposition: A | Discharge status: Home / Self Care | Payer: No Typology Code available for payment source | Attending: Physician Assistant | Admitting: Physician Assistant

## 2022-01-29 DIAGNOSIS — R051 Acute cough: Secondary | ICD-10-CM | POA: Insufficient documentation

## 2022-01-29 DIAGNOSIS — R0981 Nasal congestion: Secondary | ICD-10-CM | POA: Diagnosis not present

## 2022-01-29 DIAGNOSIS — Z1152 Encounter for screening for COVID-19: Secondary | ICD-10-CM | POA: Insufficient documentation

## 2022-01-29 DIAGNOSIS — R490 Dysphonia: Secondary | ICD-10-CM | POA: Insufficient documentation

## 2022-01-29 DIAGNOSIS — J069 Acute upper respiratory infection, unspecified: Secondary | ICD-10-CM | POA: Insufficient documentation

## 2022-01-29 LAB — SARS CORONAVIRUS 2 BY RT PCR: SARS Coronavirus 2 by RT PCR: NEGATIVE

## 2022-01-29 MED ORDER — IPRATROPIUM BROMIDE 0.06 % NA SOLN
2.0000 | Freq: Four times a day (QID) | NASAL | 0 refills | Status: DC
Start: 2022-01-29 — End: 2023-09-07

## 2022-01-29 MED ORDER — PROMETHAZINE-DM 6.25-15 MG/5ML PO SYRP: 5.0000 mL | ORAL_SOLUTION | Freq: Four times a day (QID) | ORAL | 0 refills | Status: DC | PRN

## 2022-01-29 NOTE — Discharge Instructions (Addendum)
URI/COLD SYMPTOMS: Your exam today is consistent with a viral illness. Antibiotics are not indicated at this time. Use medications as directed, including cough syrup, nasal saline, and decongestants. Your symptoms should improve over the next few days and resolve within 1-2weeks. Increase rest and fluids. F/u if symptoms worsen or predominate such as sore throat, ear pain, productive cough, shortness of breath, or if you develop high fevers or worsening fatigue over the next several days.    I will call if you are positive for COVID.  Consider antiviral medication if you do have COVID.  You will need to isolate 5 days from symptom onset and wear mask for 5 days if positive.

## 2022-01-29 NOTE — ED Triage Notes (Signed)
Pt reports sinus drainage, cough, and hoarse voice x 2 days.
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
## 2022-03-11 ENCOUNTER — Ambulatory Visit (INDEPENDENT_AMBULATORY_CARE_PROVIDER_SITE_OTHER): Payer: No Typology Code available for payment source | Admitting: Physician Assistant

## 2022-03-11 ENCOUNTER — Encounter: Payer: Self-pay | Admitting: Physician Assistant

## 2022-03-11 VITALS — BP 129/79 | HR 59 | Temp 98.2°F | Ht 66.0 in | Wt 179.0 lb

## 2022-03-11 DIAGNOSIS — Z13220 Encounter for screening for lipoid disorders: Secondary | ICD-10-CM

## 2022-03-11 DIAGNOSIS — Z7689 Persons encountering health services in other specified circumstances: Secondary | ICD-10-CM

## 2022-03-11 DIAGNOSIS — N951 Menopausal and female climacteric states: Secondary | ICD-10-CM | POA: Diagnosis not present

## 2022-03-11 DIAGNOSIS — E049 Nontoxic goiter, unspecified: Secondary | ICD-10-CM

## 2022-03-11 DIAGNOSIS — E663 Overweight: Secondary | ICD-10-CM | POA: Diagnosis not present

## 2022-03-11 DIAGNOSIS — J302 Other seasonal allergic rhinitis: Secondary | ICD-10-CM

## 2022-03-11 DIAGNOSIS — Z136 Encounter for screening for cardiovascular disorders: Secondary | ICD-10-CM

## 2022-03-11 DIAGNOSIS — Z0000 Encounter for general adult medical examination without abnormal findings: Secondary | ICD-10-CM

## 2022-03-12 LAB — COMPREHENSIVE METABOLIC PANEL
ALT: 32 IU/L (ref 0–32)
AST: 31 IU/L (ref 0–40)
Albumin/Globulin Ratio: 1.4 (ref 1.2–2.2)
Albumin: 4.4 g/dL (ref 3.8–4.9)
Alkaline Phosphatase: 73 IU/L (ref 44–121)
BUN/Creatinine Ratio: 19 (ref 9–23)
BUN: 15 mg/dL (ref 6–24)
Bilirubin Total: 0.6 mg/dL (ref 0.0–1.2)
CO2: 21 mmol/L (ref 20–29)
Calcium: 9.5 mg/dL (ref 8.7–10.2)
Chloride: 102 mmol/L (ref 96–106)
Creatinine, Ser: 0.77 mg/dL (ref 0.57–1.00)
Globulin, Total: 3.2 g/dL (ref 1.5–4.5)
Glucose: 100 mg/dL — ABNORMAL HIGH (ref 70–99)
Potassium: 4.1 mmol/L (ref 3.5–5.2)
Sodium: 138 mmol/L (ref 134–144)
Total Protein: 7.6 g/dL (ref 6.0–8.5)
eGFR: 92 mL/min/{1.73_m2} (ref >59)

## 2022-03-12 LAB — LIPID PANEL
Chol/HDL Ratio: 2.8 ratio (ref 0.0–4.4)
Cholesterol, Total: 179 mg/dL (ref 100–199)
HDL: 65 mg/dL (ref >39)
LDL Chol Calc (NIH): 101 mg/dL — ABNORMAL HIGH (ref 0–99)
Triglycerides: 68 mg/dL (ref 0–149)
VLDL Cholesterol Cal: 13 mg/dL (ref 5–40)

## 2022-03-12 LAB — TSH: TSH: 1.8 u[IU]/mL (ref 0.450–4.500)

## 2022-03-12 NOTE — Progress Notes (Signed)
Please, let pt know that her labs are wnl including her thyroid hormone level except a slight elevation in bad cholesterol/LDL, blood sugar. Advised weight loss via low carb diet and daily exercise. If she has any questions, she can contact me via mychart   [name], [facility], [place] [phone])

## 2022-03-13 DIAGNOSIS — E049 Nontoxic goiter, unspecified: Secondary | ICD-10-CM | POA: Insufficient documentation

## 2022-03-13 DIAGNOSIS — J302 Other seasonal allergic rhinitis: Secondary | ICD-10-CM | POA: Insufficient documentation

## 2022-03-13 DIAGNOSIS — N951 Menopausal and female climacteric states: Secondary | ICD-10-CM | POA: Insufficient documentation

## 2022-03-13 DIAGNOSIS — E663 Overweight: Secondary | ICD-10-CM | POA: Insufficient documentation

## 2022-03-13 DIAGNOSIS — Z7689 Persons encountering health services in other specified circumstances: Secondary | ICD-10-CM | POA: Insufficient documentation

## 2022-03-13 DIAGNOSIS — Z13220 Encounter for screening for lipoid disorders: Secondary | ICD-10-CM | POA: Insufficient documentation

## 2022-05-20 ENCOUNTER — Other Ambulatory Visit: Payer: Self-pay | Admitting: Obstetrics and Gynecology

## 2022-05-20 DIAGNOSIS — Z1231 Encounter for screening mammogram for malignant neoplasm of breast: Secondary | ICD-10-CM

## 2022-06-23 ENCOUNTER — Ambulatory Visit
Admission: RE | Admit: 2022-06-23 | Discharge: 2022-06-23 | Disposition: A | Discharge status: Home / Self Care | Payer: No Typology Code available for payment source | Source: Ambulatory Visit | Attending: Obstetrics and Gynecology | Admitting: Obstetrics and Gynecology

## 2022-06-23 DIAGNOSIS — Z1231 Encounter for screening mammogram for malignant neoplasm of breast: Secondary | ICD-10-CM | POA: Insufficient documentation

## 2022-12-01 ENCOUNTER — Other Ambulatory Visit: Payer: Self-pay | Admitting: Family

## 2022-12-01 DIAGNOSIS — G43109 Migraine with aura, not intractable, without status migrainosus: Secondary | ICD-10-CM

## 2023-06-17 ENCOUNTER — Other Ambulatory Visit: Payer: Self-pay | Admitting: Obstetrics and Gynecology

## 2023-06-17 DIAGNOSIS — Z1231 Encounter for screening mammogram for malignant neoplasm of breast: Secondary | ICD-10-CM

## 2023-06-25 ENCOUNTER — Ambulatory Visit: Admitting: Pediatrics

## 2023-07-12 ENCOUNTER — Ambulatory Visit
Admission: RE | Admit: 2023-07-12 | Discharge: 2023-07-12 | Disposition: A | Discharge status: Home / Self Care | Source: Ambulatory Visit | Attending: Obstetrics and Gynecology | Admitting: Obstetrics and Gynecology

## 2023-07-12 DIAGNOSIS — Z1231 Encounter for screening mammogram for malignant neoplasm of breast: Secondary | ICD-10-CM | POA: Insufficient documentation

## 2023-09-07 ENCOUNTER — Encounter: Payer: Self-pay | Admitting: Pediatrics

## 2023-09-07 ENCOUNTER — Ambulatory Visit: Admitting: Pediatrics

## 2023-09-07 VITALS — BP 139/83 | HR 51 | Temp 97.3°F | Ht 67.0 in | Wt 173.4 lb

## 2023-09-07 DIAGNOSIS — Z13220 Encounter for screening for lipoid disorders: Secondary | ICD-10-CM | POA: Diagnosis not present

## 2023-09-07 DIAGNOSIS — J302 Other seasonal allergic rhinitis: Secondary | ICD-10-CM

## 2023-09-07 DIAGNOSIS — Z1330 Encounter for screening examination for mental health and behavioral disorders, unspecified: Secondary | ICD-10-CM

## 2023-09-07 DIAGNOSIS — Z131 Encounter for screening for diabetes mellitus: Secondary | ICD-10-CM

## 2023-09-07 DIAGNOSIS — Z7689 Persons encountering health services in other specified circumstances: Secondary | ICD-10-CM

## 2023-09-07 DIAGNOSIS — Z780 Asymptomatic menopausal state: Secondary | ICD-10-CM | POA: Diagnosis not present

## 2023-09-07 MED ORDER — PAROXETINE HCL 10 MG PO TABS
10.0000 mg | ORAL_TABLET | Freq: Every day | ORAL | 2 refills | Status: DC
Start: 2023-09-07 — End: 2023-10-01

## 2023-09-07 NOTE — Assessment & Plan Note (Signed)
 Chronic sinus symptoms potentially worsened by allergic rhinitis. Current Flonase use at night improves symptoms. Discussed ENT referral for further evaluation, including allergy testing or sinus interventions. - Refer to ENT for further evaluation and management.

## 2023-09-07 NOTE — Patient Instructions (Addendum)
 Paxil  10mg  daily   Good to meet you! Welcome to [facility]!  As your primary care doctor, I look forward to working with you to help you reach your health goals.  Please be aware of a couple of logistical items: - If you message me on mychart, it may take me 1-2 business days to get back to you. This is for non-urgent messaging.  - If you require urgent clinical attention, please call the clinic or present to urgent care/emergency room - If you have labs, I typically will send a message about them in 1-2 business days. - I am not here on Mondays, otherwise will be available from Tuesday-Friday during 8a-5pm.

## 2023-09-07 NOTE — Progress Notes (Signed)
 Establish Care Note  BP 139/83   Pulse (!) 51   Temp (!) 97.3 F (36.3 C) (Oral)   Ht 5' 7 (1.702 m)   Wt 173 lb 6.4 oz (78.7 kg)   LMP 06/01/2021 (Exact Date)   SpO2 99%   BMI 27.16 kg/m    Subjective:    Patient ID: Alyssa Bonilla, female    DOB: 08-01-1970, 53 y.o.   MRN: [id number]  HPI: Alyssa Bonilla is a 53 y.o. female  Chief Complaint  Patient presents with   Establish Care    Discuss menopause issues     Establishing care, the following was discussed today:  Discussed the use of AI scribe software for clinical note transcription with the patient, who gave verbal consent to proceed.  History of Present Illness   Alyssa Bonilla is a 53 year old female who presents with menopause symptoms.  She is experiencing menopause symptoms including hot flashes, night sweats, and insomnia. Her last menstrual period was in 2023. She has not been on any medications for these symptoms, as her previous provider recommended yoga. She reports that she can deal with hot flashes, but finds night sweats more bothersome as they disrupt her sleep. She wakes up feeling hot and drenched, then cold after removing covers, leading to poor sleep quality.  She reports weight gain since entering menopause and notes soreness in her legs and feet, impacting her ability to walk as she used to. She has not been taking any medications for these symptoms and is concerned about potential side effects of medications, having had negative experiences with Prozac and Wellbutrin in the past, which made her feel 'zombified.'  For insomnia, she has been using Thrive vitamins, specifically a sleep aid gummy, but finds it leaves her feeling 'out of it' the next morning. She also mentions sinus issues contributing to snoring, which she manages with Flonase at night, improving her sleep quality.  In terms of family history, her older sister passed away from hemochromatosis, a genetic condition, and she is  concerned about her own liver health. She is interested in having her liver function checked as part of her routine blood work.        Current Outpatient Medications on File Prior to Visit  Medication Sig Dispense Refill   aspirin-acetaminophen -caffeine  (EXCEDRIN MIGRAINE) 250-250-65 MG tablet Take 2 tablets by mouth every 6 (six) hours as needed for headache. (Patient not taking: Reported on 09/07/2023)     Calcium Carbonate-Vit D-Min (CALCIUM 1200 PO) Take by mouth. (Patient not taking: Reported on 09/07/2023)     Cholecalciferol (VITAMIN D3) 2000 units capsule Take 2,000 Units by mouth daily. (Patient not taking: Reported on 09/07/2023)     clobetasol  cream (TEMOVATE ) 0.05 % Apply 1 application. topically 2 (two) times daily. (Patient not taking: Reported on 09/07/2023) 30 g 0   Multiple Vitamins-Minerals (THRIVE FOR LIFE WOMENS PO) Take by mouth. (Patient not taking: Reported on 09/07/2023)     [DISCONTINUED] SUMAtriptan (IMITREX) 20 MG/ACT nasal spray Place 20 mg into the nose every 2 (two) hours as needed for migraine or headache. May repeat in 2 hours if headache persists or recurs.     No current facility-administered medications on file prior to visit.    #HM Will review HM records and updated as needed.  Relevant past medical, surgical, family and social history reviewed and updated as indicated. Interim medical history since our last visit reviewed. Allergies and medications reviewed and updated.  ROS  per HPI unless specifically indicated above     Objective:    BP 139/83   Pulse (!) 51   Temp (!) 97.3 F (36.3 C) (Oral)   Ht 5' 7 (1.702 m)   Wt 173 lb 6.4 oz (78.7 kg)   LMP 06/01/2021 (Exact Date)   SpO2 99%   BMI 27.16 kg/m   Wt Readings from Last 3 Encounters:  09/07/23 173 lb 6.4 oz (78.7 kg)  03/11/22 179 lb (81.2 kg)  06/06/21 168 lb 4 oz (76.3 kg)     Physical Exam Constitutional:      Appearance: Normal appearance.  Pulmonary:     Effort: Pulmonary effort is  normal.  Musculoskeletal:        General: Normal range of motion.  Skin:    Comments: Normal skin color  Neurological:     General: No focal deficit present.     Mental Status: She is alert. Mental status is at baseline.  Psychiatric:        Mood and Affect: Mood normal.        Behavior: Behavior normal.        Thought Content: Thought content normal.         09/07/2023    8:52 AM 03/11/2022    9:01 AM 06/06/2021    2:53 PM 02/19/2020    3:01 PM 12/05/2019    2:24 PM  Depression screen PHQ 2/9  Decreased Interest 0 0 0 0 0  Down, Depressed, Hopeless 0 0 0 0 0  PHQ - 2 Score 0 0 0 0 0  Altered sleeping 2 0 1    Tired, decreased energy 1 1 1     Change in appetite 0 0 0    Feeling bad or failure about yourself  0 0 0    Trouble concentrating 0 0 0    Moving slowly or fidgety/restless 0 0 0    Suicidal thoughts 0 0 0    PHQ-9 Score 3 1 2     Difficult doing work/chores Not difficult at all Not difficult at all Not difficult at all          09/07/2023    8:52 AM 06/06/2021    2:52 PM  GAD 7 : Generalized Anxiety Score  Nervous, Anxious, on Edge 0 2  Control/stop worrying 0 0  Worry too much - different things 0 0  Trouble relaxing 0 0  Restless 0 0  Easily annoyed or irritable 0 2  Afraid - awful might happen 0 0  Total GAD 7 Score 0 4  Anxiety Difficulty Not difficult at all Not difficult at all       Assessment & Plan:  Assessment & Plan   Menopause Assessment & Plan: Menopausal symptoms persist despite previous escitalopram treatment. Discussed Paxil  as a non-hormonal option, effective for hot flashes and insomnia. Explained potential side effects and short-acting nature. Engaged in shared decision-making; she is open to Paxil  but concerned about personality changes. - Prescribe Paxil  10 mg daily. - Order home sleep study to rule out sleep apnea. - Reassess symptoms in 3-4 weeks.  Orders: -     PARoxetine  HCl; Take 1 tablet (10 mg total) by mouth daily.  Dispense:  30 tablet; Refill: 2 -     CBC with Differential/Platelet -     Comprehensive metabolic panel with GFR -     Thyroid  Panel With TSH  Seasonal allergies Assessment & Plan: Chronic sinus symptoms potentially worsened by allergic rhinitis. Current  Flonase use at night improves symptoms. Discussed ENT referral for further evaluation, including allergy testing or sinus interventions. - Refer to ENT for further evaluation and management.   Orders: -     Ambulatory referral to ENT  Diabetes mellitus screening -     Hemoglobin A1c  Lipid screening -     Lipid panel  Encounter to establish care Reviewed available patient record including history, medications, problem list. HM updated as able. Will review and/or request outside records (if applicable) and will fill remaining HM gaps as needed at follow up visit.  Encounter for behavioral health screening As part of their intake evaluation, the patient was screened for depression, anxiety.  PHQ9 SCORE 3, GAD7 SCORE 0. Screening results negative for tested conditions. See plan under problem/diagnosis above.   Follow up plan: Return in about 3 weeks (around 09/28/2023) for Physical.  Hadassah SHAUNNA Nett, MD

## 2023-09-07 NOTE — Assessment & Plan Note (Signed)
 Menopausal symptoms persist despite previous escitalopram treatment. Discussed Paxil  as a non-hormonal option, effective for hot flashes and insomnia. Explained potential side effects and short-acting nature. Engaged in shared decision-making; she is open to Paxil  but concerned about personality changes. - Prescribe Paxil  10 mg daily. - Order home sleep study to rule out sleep apnea. - Reassess symptoms in 3-4 weeks.

## 2023-09-08 LAB — CBC WITH DIFFERENTIAL/PLATELET
Basophils Absolute: 0 x10E3/uL (ref 0.0–0.2)
Basos: 1 %
EOS (ABSOLUTE): 0.2 x10E3/uL (ref 0.0–0.4)
Eos: 4 %
Hematocrit: 45.4 % (ref 34.0–46.6)
Hemoglobin: 14.9 g/dL (ref 11.1–15.9)
Immature Grans (Abs): 0 x10E3/uL (ref 0.0–0.1)
Immature Granulocytes: 0 %
Lymphocytes Absolute: 2.5 x10E3/uL (ref 0.7–3.1)
Lymphs: 45 %
MCH: 29.7 pg (ref 26.6–33.0)
MCHC: 32.8 g/dL (ref 31.5–35.7)
MCV: 91 fL (ref 79–97)
Monocytes Absolute: 0.5 x10E3/uL (ref 0.1–0.9)
Monocytes: 9 %
Neutrophils Absolute: 2.2 x10E3/uL (ref 1.4–7.0)
Neutrophils: 41 %
Platelets: 210 x10E3/uL (ref 150–450)
RBC: 5.01 x10E6/uL (ref 3.77–5.28)
RDW: 13.2 % (ref 11.7–15.4)
WBC: 5.4 x10E3/uL (ref 3.4–10.8)

## 2023-09-08 LAB — COMPREHENSIVE METABOLIC PANEL WITH GFR
ALT: 41 IU/L — ABNORMAL HIGH (ref 0–32)
AST: 28 IU/L (ref 0–40)
Albumin: 4.5 g/dL (ref 3.8–4.9)
Alkaline Phosphatase: 87 IU/L (ref 44–121)
BUN/Creatinine Ratio: 35 — ABNORMAL HIGH (ref 9–23)
BUN: 25 mg/dL — ABNORMAL HIGH (ref 6–24)
Bilirubin Total: 0.5 mg/dL (ref 0.0–1.2)
CO2: 20 mmol/L (ref 20–29)
Calcium: 9.8 mg/dL (ref 8.7–10.2)
Chloride: 102 mmol/L (ref 96–106)
Creatinine, Ser: 0.72 mg/dL (ref 0.57–1.00)
Globulin, Total: 2.7 g/dL (ref 1.5–4.5)
Glucose: 98 mg/dL (ref 70–99)
Potassium: 4.1 mmol/L (ref 3.5–5.2)
Sodium: 141 mmol/L (ref 134–144)
Total Protein: 7.2 g/dL (ref 6.0–8.5)
eGFR: 99 mL/min/1.73 (ref >59)

## 2023-09-08 LAB — LIPID PANEL
Chol/HDL Ratio: 3.7 ratio (ref 0.0–4.4)
Cholesterol, Total: 212 mg/dL — ABNORMAL HIGH (ref 100–199)
HDL: 58 mg/dL (ref >39)
LDL Chol Calc (NIH): 140 mg/dL — ABNORMAL HIGH (ref 0–99)
Triglycerides: 81 mg/dL (ref 0–149)
VLDL Cholesterol Cal: 14 mg/dL (ref 5–40)

## 2023-09-08 LAB — HEMOGLOBIN A1C
Est. average glucose Bld gHb Est-mCnc: 114 mg/dL
Hgb A1c MFr Bld: 5.6 % (ref 4.8–5.6)

## 2023-09-08 LAB — THYROID PANEL WITH TSH
Free Thyroxine Index: 2.9 (ref 1.2–4.9)
T3 Uptake Ratio: 33 % (ref 24–39)
T4, Total: 8.8 ug/dL (ref 4.5–12.0)
TSH: 4.4 u[IU]/mL (ref 0.450–4.500)

## 2023-09-10 ENCOUNTER — Ambulatory Visit: Payer: Self-pay | Admitting: Pediatrics

## 2023-09-29 ENCOUNTER — Other Ambulatory Visit: Payer: Self-pay | Admitting: Pediatrics

## 2023-09-29 DIAGNOSIS — Z780 Asymptomatic menopausal state: Secondary | ICD-10-CM

## 2023-09-30 NOTE — Telephone Encounter (Signed)
 Requested medication (s) are due for refill today: No  Requested medication (s) are on the active medication list: Yes  Last refill:  09/07/23  Future visit scheduled: Yes  Notes to clinic:  See pharmacy request.    Requested Prescriptions  Pending Prescriptions Disp Refills   PARoxetine  (PAXIL ) 10 MG tablet [Pharmacy Med Name: PAROXETINE  HCL 10 MG TABLET] 90 tablet 1    Sig: TAKE 1 TABLET BY MOUTH EVERY DAY     Psychiatry:  Antidepressants - SSRI Passed - 09/30/2023  3:48 PM      Passed - Valid encounter within last 6 months    Recent Outpatient Visits           3 weeks ago Menopause    [facility] Herold Hadassah SQUIBB, MD

## 2023-10-06 ENCOUNTER — Ambulatory Visit: Admitting: Pediatrics

## 2023-10-06 VITALS — BP 132/87 | HR 67 | Temp 98.6°F | Wt 172.6 lb

## 2023-10-06 DIAGNOSIS — R400 Somnolence: Secondary | ICD-10-CM

## 2023-10-06 DIAGNOSIS — Z780 Asymptomatic menopausal state: Secondary | ICD-10-CM | POA: Diagnosis not present

## 2023-10-06 DIAGNOSIS — E663 Overweight: Secondary | ICD-10-CM

## 2023-10-06 MED ORDER — GABAPENTIN 300 MG PO CAPS: 300.0000 mg | ORAL_CAPSULE | Freq: Every day | ORAL | 1 refills | Status: DC

## 2023-10-06 NOTE — Progress Notes (Signed)
 Office Visit  BP 132/87   Pulse 67   Temp 98.6 F (37 C) (Oral)   Wt 172 lb 9.6 oz (78.3 kg)   LMP 06/01/2021 (Exact Date)   SpO2 99%   BMI 27.03 kg/m    Subjective:    Patient ID: Alyssa Bonilla, female    DOB: Jul 22, 1970, 53 y.o.   MRN: [id number]  HPI: Alyssa Bonilla is a 53 y.o. female  Chief Complaint  Patient presents with   Follow-up    Medication for menopause didn't work wants to try something else     Discussed the use of AI scribe software for clinical note transcription with the patient, who gave verbal consent to proceed.  History of Present Illness   Alyssa Bonilla is a 53 year old female who presents with menopausal symptoms and medication side effects.  She experiences intense hormonal migraines and eczema flare-ups, which are worse than her usual migraines. These symptoms are associated with her menstrual cycle. Her eczema recently broke out on her finger but is now healing. She has previously tried Prozac and Lexapro, which exacerbated her migraines and eczema, making her feel as if she was having her period again.  She is currently experiencing menopausal symptoms, including hot flashes and weight gain. Despite efforts to exercise and maintain healthy eating habits, she finds it difficult to lose weight. Her weight has increased from her usual range of 145-150 pounds to a higher weight.  She reports sinus issues, including a sensation of drainage down her throat when lying down. She has been prescribed Flonase and has a history of a deviated septum, with a follow-up scheduled with an ENT specialist at the end of the month.  She experiences snoring, which her husband has noted. No symptoms like waking up gagging. She does not feel she has the typical symptoms of sleep apnea.  Her feet have increased in size, requiring a larger shoe size for comfort. She also experiences pain in her legs and feet, similar to a friend who is going through  menopause.  Her symptoms are worse at night.         10/12/2023    9:00 AM  Results of the Epworth flowsheet  Sitting and reading 3  Watching TV 3  Sitting, inactive in a public place (e.g. a theatre or a meeting) 3  As a passenger in a car for an hour without a break 2  Lying down to rest in the afternoon when circumstances permit 2  Sitting and talking to someone 1  Sitting quietly after a lunch without alcohol 2  In a car, while stopped for a few minutes in traffic 0  Total score 16     Relevant past medical, surgical, family and social history reviewed and updated as indicated. Interim medical history since our last visit reviewed. Allergies and medications reviewed and updated.  ROS per HPI unless specifically indicated above     Objective:    BP 132/87   Pulse 67   Temp 98.6 F (37 C) (Oral)   Wt 172 lb 9.6 oz (78.3 kg)   LMP 06/01/2021 (Exact Date)   SpO2 99%   BMI 27.03 kg/m   Wt Readings from Last 3 Encounters:  10/06/23 172 lb 9.6 oz (78.3 kg)  09/07/23 173 lb 6.4 oz (78.7 kg)  03/11/22 179 lb (81.2 kg)     Physical Exam Constitutional:      Appearance: Normal appearance.  Pulmonary:  Effort: Pulmonary effort is normal.  Musculoskeletal:        General: Normal range of motion.  Skin:    Comments: Normal skin color  Neurological:     General: No focal deficit present.     Mental Status: She is alert. Mental status is at baseline.  Psychiatric:        Mood and Affect: Mood normal.        Behavior: Behavior normal.        Thought Content: Thought content normal.         10/06/2023    1:30 PM 09/07/2023    8:52 AM 03/11/2022    9:01 AM 06/06/2021    2:53 PM 02/19/2020    3:01 PM  Depression screen PHQ 2/9  Decreased Interest 0 0 0 0 0  Down, Depressed, Hopeless 0 0 0 0 0  PHQ - 2 Score 0 0 0 0 0  Altered sleeping 2 2 0 1   Tired, decreased energy 1 1 1 1    Change in appetite 1 0 0 0   Feeling bad or failure about yourself  0 0 0 0    Trouble concentrating 0 0 0 0   Moving slowly or fidgety/restless 0 0 0 0   Suicidal thoughts 0 0 0 0   PHQ-9 Score 4 3 1 2    Difficult doing work/chores Not difficult at all Not difficult at all Not difficult at all Not difficult at all        10/06/2023    1:31 PM 09/07/2023    8:52 AM 06/06/2021    2:52 PM  GAD 7 : Generalized Anxiety Score  Nervous, Anxious, on Edge 0 0 2  Control/stop worrying 0 0 0  Worry too much - different things 0 0 0  Trouble relaxing 0 0 0  Restless 0 0 0  Easily annoyed or irritable 1 0 2  Afraid - awful might happen 0 0 0  Total GAD 7 Score 1 0 4  Anxiety Difficulty Not difficult at all Not difficult at all Not difficult at all       Assessment & Plan:  Assessment & Plan   Menopause Hot flashes and hormonal migraines. Prozac and Lexapro poorly tolerated. Gabapentin considered. Estrogen therapy if gabapentin fails. - Initiate gabapentin at low dose, taken at night. - Consider gynecologist referral for hormonal therapy discussion if needed. - Follow-up in 2-3 weeks to assess gabapentin effectiveness. -     Gabapentin; Take 1 capsule (300 mg total) by mouth at bedtime.  Dispense: 30 capsule; Refill: 1  Overweight (BMI 25.0-29.9) Weight gain likely related to menopause and possible sleep apnea. Difficulty losing weight despite efforts.  Daytime sleepiness Symptoms include snoring and sinus drainage. Testing warranted to rule out and aid weight management. - Ensure home sleep test is scheduled and completed.    Follow up plan: Return in about 3 weeks (around 10/27/2023) for menopause.  Hadassah SHAUNNA Nett, MD

## 2023-10-12 ENCOUNTER — Encounter: Payer: Self-pay | Admitting: Pediatrics

## 2023-10-27 ENCOUNTER — Ambulatory Visit: Admitting: Pediatrics

## 2023-12-05 ENCOUNTER — Other Ambulatory Visit: Payer: Self-pay | Admitting: Pediatrics

## 2023-12-05 DIAGNOSIS — Z780 Asymptomatic menopausal state: Secondary | ICD-10-CM

## 2023-12-08 NOTE — Telephone Encounter (Signed)
 Requested Prescriptions  Pending Prescriptions Disp Refills   gabapentin  (NEURONTIN ) 300 MG capsule [Pharmacy Med Name: GABAPENTIN  300 MG CAPSULE] 30 capsule 1    Sig: TAKE 1 CAPSULE BY MOUTH EVERYDAY AT BEDTIME     Neurology: Anticonvulsants - gabapentin  Passed - 12/08/2023  2:33 PM      Passed - Cr in normal range and within 360 days    Creat  Date Value Ref Range Status  06/06/2021 0.72 0.50 - 1.03 mg/dL Final   Creatinine, Ser  Date Value Ref Range Status  09/07/2023 0.72 0.57 - 1.00 mg/dL Final         Passed - Completed PHQ-2 or PHQ-9 in the last 360 days      Passed - Valid encounter within last 12 months    Recent Outpatient Visits           2 months ago Menopause   [facility] Her[facility] Hadassah SQUIBB, MD   3 months ago Menopause   [facility] Herold Hadassah SQUIBB, MD
